# Patient Record
Sex: Female | Born: 1955
Health system: Southern US, Community
[De-identification: ages and names within clinical notes are randomized; demographics above are authoritative.]

## PROBLEM LIST (undated history)

## (undated) DIAGNOSIS — I10 Essential (primary) hypertension: Secondary | ICD-10-CM

## (undated) DIAGNOSIS — E039 Hypothyroidism, unspecified: Secondary | ICD-10-CM

## (undated) DIAGNOSIS — K5792 Diverticulitis of intestine, part unspecified, without perforation or abscess without bleeding: Secondary | ICD-10-CM

## (undated) DIAGNOSIS — E063 Autoimmune thyroiditis: Secondary | ICD-10-CM

## (undated) DIAGNOSIS — E119 Type 2 diabetes mellitus without complications: Secondary | ICD-10-CM

## (undated) DIAGNOSIS — E785 Hyperlipidemia, unspecified: Secondary | ICD-10-CM

## (undated) DIAGNOSIS — E079 Disorder of thyroid, unspecified: Secondary | ICD-10-CM

## (undated) HISTORY — PX: COLONOSCOPY W/ POLYPECTOMY: SHX1380

## (undated) HISTORY — PX: ABDOMINAL HYSTERECTOMY: SHX81

## (undated) HISTORY — PX: APPENDECTOMY: SHX54

## (undated) HISTORY — PX: CHOLECYSTECTOMY: SHX55

---

## 2006-04-03 ENCOUNTER — Ambulatory Visit (HOSPITAL_COMMUNITY): Admission: RE | Admit: 2006-04-03 | Discharge: 2006-04-03 | Payer: Self-pay | Admitting: Family Medicine

## 2006-04-13 ENCOUNTER — Emergency Department (HOSPITAL_COMMUNITY): Admission: EM | Admit: 2006-04-13 | Discharge: 2006-04-13 | Payer: Self-pay | Admitting: Emergency Medicine

## 2007-04-06 ENCOUNTER — Ambulatory Visit (HOSPITAL_COMMUNITY): Admission: RE | Admit: 2007-04-06 | Discharge: 2007-04-06 | Payer: Self-pay | Admitting: Family Medicine

## 2007-04-29 ENCOUNTER — Inpatient Hospital Stay (HOSPITAL_COMMUNITY): Admission: EM | Admit: 2007-04-29 | Discharge: 2007-04-30 | Payer: Self-pay | Admitting: Emergency Medicine

## 2007-04-30 ENCOUNTER — Ambulatory Visit: Payer: Self-pay | Admitting: Gastroenterology

## 2007-12-25 ENCOUNTER — Ambulatory Visit (HOSPITAL_COMMUNITY): Admission: RE | Admit: 2007-12-25 | Discharge: 2007-12-25 | Payer: Self-pay | Admitting: Family Medicine

## 2009-01-04 ENCOUNTER — Emergency Department (HOSPITAL_COMMUNITY): Admission: EM | Admit: 2009-01-04 | Discharge: 2009-01-04 | Payer: Self-pay | Admitting: Emergency Medicine

## 2010-01-12 ENCOUNTER — Ambulatory Visit (HOSPITAL_COMMUNITY): Admission: RE | Admit: 2010-01-12 | Discharge: 2010-01-12 | Payer: Self-pay | Admitting: Internal Medicine

## 2010-09-11 LAB — URINALYSIS, ROUTINE W REFLEX MICROSCOPIC
Glucose, UA: NEGATIVE mg/dL
Specific Gravity, Urine: >1.03 — ABNORMAL HIGH (ref 1.005–1.030)
Urobilinogen, UA: 0.2 mg/dL (ref 0.0–1.0)
pH: 5 (ref 5.0–8.0)

## 2010-09-11 LAB — DIFFERENTIAL
Basophils Absolute: 0 10*3/uL (ref 0.0–0.1)
Basophils Relative: 0 % (ref 0–1)
Eosinophils Relative: 1 % (ref 0–5)
Monocytes Absolute: 0.8 10*3/uL (ref 0.1–1.0)
Monocytes Relative: 6 % (ref 3–12)

## 2010-09-11 LAB — CBC
Platelets: 235 10*3/uL (ref 150–400)
RDW: 13.9 % (ref 11.5–15.5)
WBC: 12.8 10*3/uL — ABNORMAL HIGH (ref 4.0–10.5)

## 2010-09-11 LAB — COMPREHENSIVE METABOLIC PANEL
AST: 24 U/L (ref 0–37)
Albumin: 4 g/dL (ref 3.5–5.2)
Alkaline Phosphatase: 40 U/L (ref 39–117)
Chloride: 100 mEq/L (ref 96–112)
GFR calc Af Amer: >60 mL/min (ref >60)
Potassium: 3.6 mEq/L (ref 3.5–5.1)
Sodium: 138 mEq/L (ref 135–145)
Total Bilirubin: 0.9 mg/dL (ref 0.3–1.2)
Total Protein: 7 g/dL (ref 6.0–8.3)

## 2010-09-11 LAB — URINE MICROSCOPIC-ADD ON

## 2010-10-19 NOTE — Consult Note (Signed)
[name], [name]                 ACCOUNT NO.:  [id number]   MEDICAL RECORD NO.:  [id number]          PATIENT TYPE:  INP   LOCATION:  A311                          FACILITY:  [facility]   PHYSICIAN:  Kassie Mends, M.D.      DATE OF BIRTH:  December 18, 1955   DATE OF CONSULTATION:  04/30/2007  DATE OF DISCHARGE:                                 CONSULTATION   REQUESTING PHYSICIAN:  [facility].   PRIMARY CARE PHYSICIAN:  Dr. Phillips Odor.   GASTROENTEROLOGIST:  Dr. Elder Cyphers in [place], [place].   HISTORY OF PRESENT ILLNESS:  [name] is a 55 year old Caucasian female  who developed severe bilateral lower quadrant abdominal pain about 4  days ago. She did notice some loose, nonbloody stools with the pain. She  has had chills and fever. Finally, in the wee hours of yesterday  morning, she came to the hospital as the pain was so severe. She had 5  loose stools yesterday, 3 this morning. She was found to have a white  blood cell count of 15.3 yesterday, down to 9.1 today. CT of abdomen and  pelvis with contrast showed pericolonic stranding with the transverse  segment of the sigmoid colon associated with multiple diverticula. She  did have one episode of nausea and vomiting yesterday but feels this was  secondary to the pain medication. She has had no further vomiting since.  She has a history of occasional straining with stools prior to the onset  of this and occasional diarrhea but otherwise no chronic GI complaints.  She has been started on Cipro and Flagyl IV since admitted and feels  much improved.   PAST MEDICAL AND SURGICAL HISTORY:  She tells me she had diverticulitis  about one year ago. Back in 2006, she had a colonoscopy which reportedly  was normal. She does not remember being told that she ever had  diverticula or polyps. She had a cholecystectomy 10 years ago. She had  appendectomy as a child, complete hysterectomy 4 years ago. She has  occasional GERD for which she takes Tums  with good response rarely. She  tells me she had history of remote peptic ulcer disease with EGD by Dr.  Elder Cyphers years ago. She has history of disk problems. She has also had  problems with swallowing pills but no significant findings on EGD per  her report.   MEDICATIONS PRIOR TO ADMISSION:  Zebeta p.r.n. for palpitations.   ALLERGIES:  MORPHINE CAUSES VOMITING.   FAMILY HISTORY:  Significant for a father with adenomatous polyps and  diverticulitis as well as 2 paternal aunts and 2 paternal uncles with  colon cancer.   SOCIAL HISTORY:  [name] is married. 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 She has a remote  history of tobacco use. Denies any alcohol or drug use.   REVIEW OF SYSTEMS:  See HPI. Otherwise negative.   PHYSICAL EXAMINATION:  VITAL SIGNS:  Weight 107.4 kg, height 65 inches,  temperature 98.3 degrees, pulse 79, respirations 20, blood pressure  110/65, and O2 saturation  96% on room air.  GENERAL:  She is an obese, Caucasian female who is alert, oriented,  pleasant, cooperative in no acute distress.  HEENT:  Sclerae are clear, nonicteric. Conjunctivae are pink. Oral  mucosa pink and moist without any lesions.  NECK:  Supple without any masses or thyromegaly.  CHEST:  Heart regular rate and rhythm with normal S1 and S2 without  murmurs, clicks, rubs or gallops.  LUNGS:  Clear to auscultation bilaterally.  ABDOMEN:  Protuberant with positive bowel sounds x4. No bruits  auscultated.  She does have significant tenderness in the suprapubic area, left upper  quadrant and left lower quadrant and around the umbilicus. There is no  rebound tenderness or guarding. No hepatosplenomegaly or mass. No  rebound tenderness or guarding.  EXTREMITIES:  Without clubbing or edema bilaterally.  SKIN:  Pink, warm and dry without any rash or jaundice.   LABORATORY STUDIES:  She has a WBC of 9.1, hemoglobin 13, hematocrit  37.8,  platelets 253. Calcium 8.9, sodium 141, potassium 3.6, chloride  106, CO2 27, BUN 12, creatinine 0.86, and glucose 117. Urine specific  gravity was high as urinalysis showed trace blood, small leukocytes, few  squamous cells and few bacteria.   IMPRESSION:  [name] is a 55 year old female with acute onset of  second episode uncomplicated diverticulitis in the past 2 years since  her colonoscopy in [place], [place] by Dr. Elder Cyphers. There is no  history of diverticula on report per [name] but will request for  further review. She does have a strong family history of colon cancer  and polyps in her father and 2 paternal aunts and 2 paternal uncles. She  is responding well to Cipro and Flagyl. She is anxious for discharge  home and change to p.o. medications, which I feel is reasonable, given  the fact that she has had very little nausea and vomiting.   PLAN:  1. Will change to p.o. Cipro and Flagyl (she cannot swallow pills;      there is no liquid available per pocket with pharmacy). She may      crush her pills.  2. Low-residue diet.  3. Obtain colonoscopy report from Dr. Elder Cyphers. If there is no mention      of diverticula, would consider repeating colonoscopy in 8-10 weeks      once her diverticulitis is resolved, given her strong family      history of colon cancer.   We would like to thank the [facility] for allowing [place] to  participate in the care of [name].      Lorenza Burton, N.P.      Kassie Mends, M.D.  Electronically Signed    KJ/MEDQ  D:  04/30/2007  T:  04/30/2007  Job:  [id number]

## 2010-10-19 NOTE — H&P (Signed)
[name], Rios                 ACCOUNT NO.:  [id number]   MEDICAL RECORD NO.:  [id number]          PATIENT TYPE:  INP   LOCATION:  A311                          FACILITY:  [facility]   PHYSICIAN:  Marcello Moores, MD   DATE OF BIRTH:  [birth year]   DATE OF ADMISSION:  04/29/2007  DATE OF DISCHARGE:  LH                              HISTORY & PHYSICAL   PRIMARY CARE PHYSICIAN:  Dr. Berle Mull.   CHIEF COMPLAINT:  Abdominal pain and diarrhea of four days' duration   HISTORY OF PRESENT ILLNESS:  Ms. [name] is a 55 year old female patient  with history of diverticulosis and one episode of diverticulitis. She  started to have abdominal pain on the lower side more bilateral and with  episodes of nonbloody diarrhea for the last 4 days.  The patient stated  that the it was worse in the last 2 days, and she decided to come to the  emergency room. In the emergency room, scan of the abdomen was done and  showed signs of diverticulitis, and admission was called, and she was  admitted for further management and evaluation.  The patient stated that  she had no fever.  She had no urinary complaints, and this abdominal  pain was lower side, not localized, a dull pain and associated with  nonbloody frequent diarrhea 4-5 times a day.  She had one episode of  vomiting, and she has nausea as well.  She has no pulmonary or urinary  tract complaints. She was not taking any medications at home.   REVIEW OF SYSTEMS:  A 10-point Review of Systems is as dictated,  otherwise noncontributory   ALLERGIES:  No known drug allergies.   SOCIAL HISTORY:  She stopped smoking, and she denies alcohol or drug  abuse.   FAMILY HISTORY:  She has history of GI cancer in the family.   PAST MEDICAL HISTORY:  Diverticulosis with episode of diverticulitis.   HOME MEDICATIONS:  Zebeta 5 mg p.r.n.   PHYSICAL EXAMINATION:  GENERAL:  The patient is lying in the bed without  any distress.  VITAL SIGNS:  Temperature is 98.6, and  blood pressure is 120/65, pulse  89, respiratory rate 18, and saturation 98%.  HEENT:  She has pink conjunctivae.  Nonicteric sclera.  NECK:  Supple.  CHEST:  She has good air entry bilaterally.  CARDIOVASCULAR:  S1 and S2 regular.  ABDOMEN:  Soft.  Normoactive bowel sounds and slight tenderness on the  lower part of the abdomen, obese.  EXTREMITIES:  She has no pedal or pretibial edema.  CNS:  She is alert and oriented.   LABORATORY DATA:  White blood cells 15, hemoglobin 14, hematocrit 43,  platelet count 292. Chemistry:  Sodium 143,  potassium 4, chloride 101,  bicarb 30, glucose 136, BUN 13, creatinine 0.8. Urinalysis:  Blood cells  7-10, and blood culture is pending.   CAT scan of the abdomen as a preliminary report:  Sign of diverticulitis   ASSESSMENT:  Abdominal pain with episodes of diarrhea with history of  diverticulosis and episode of diverticulitis. Before the patient came  with similar episodes, and CAT scan showed sign of diverticulitis.  We  will admit her and put her on Flagyl and Cipro.  We will give her IV  fluids and put her n.p.o. for today.  We will consult GI tomorrow  morning for further evaluation.  Otherwise, the patient is fairly  stable, and she will have DVT as well as GI prophylaxis and pain  management as well.      Marcello Moores, MD  Electronically Signed     MT/MEDQ  D:  04/29/2007  T:  04/29/2007  Job:  [id number]

## 2010-10-19 NOTE — Discharge Summary (Signed)
[name], [name]                 ACCOUNT NO.:  [id number]   MEDICAL RECORD NO.:  [id number]          PATIENT TYPE:  INP   LOCATION:  A311                          FACILITY:  [facility]   PHYSICIAN:  Marcello Moores, MD   DATE OF BIRTH:  [birth year]   DATE OF ADMISSION:  04/29/2007  DATE OF DISCHARGE:  11/24/2008LH                               DISCHARGE SUMMARY   PRIMARY CARE PHYSICIAN:  Dr. Phillips Odor.   DISCHARGE DIAGNOSES:  1. Past history of one episode of diverticulitis, resolving.  2. History of diverticulosis.   HOME MEDICATIONS:  1. Flagyl 500 mg b.i.d. for 10 days.  2. Cipro 250 mg p.o. b.i.d. for 10 days.   HOSPITAL COURSE:  She is a 55 year old female patient with past medical  history of diverticulosis and one episode of diverticulitis last year,  and she came with abdominal pain and diarrhea of 4 days' duration.  She  was put on Flagyl and Cipro, and she improved overnight within the last  24 hours, and she asked to go home and have followup with her primary  care physician.  She was evaluated by gastroenterologist, Dr. Cira Servant, as  well, and she will have followup with her primary care physician  as  well as with a gastroenterologist.  She will have followup with surgeon  as well for possible elective surgery after discussing with her primary  care physician as well as with the patient.   PHYSICAL EXAMINATION:  VITAL SIGNS:  Today are stable with temperature  98, pulse 79, respiratory rate 20, blood pressure 110/55.  HEENT:  She has pink conjunctivae, nonicteric sclera.  NECK:  Supple.  CHEST:  She has good air entry bilaterally.  CARDIOVASCULAR:  S1-S2 regular.  ABDOMEN:  Soft.  Slight tenderness.  Otherwise normoactive bowel sounds.  EXTREMITIES:  No pedal edema.   LABORATORY DATA:  White blood cells 9, hemoglobin 13, hematocrit 37.8,  platelet count 253.  On the chemistries, sodium is 141, potassium 3.6,  chloride 106, CO 27, glucose 117, BUN 12, creatinine 0.8.   DISCHARGE/PLAN:  The patient is improved, and she wants to go home.  She  was discharged after discussed with her and with Dr. Cira Servant,  gastroenterologist.      Marcello Moores, MD  Electronically Signed     MT/MEDQ  D:  04/30/2007  T:  04/30/2007  Job:  [id number]   cc:   Dr. Phillips Odor

## 2011-03-15 LAB — BASIC METABOLIC PANEL
CO2: 27
Calcium: 8.9
Chloride: 106
GFR calc Af Amer: >60
Glucose, Bld: 117 — ABNORMAL HIGH
Sodium: 141

## 2011-03-15 LAB — CBC
HCT: 43
Hemoglobin: 13
Hemoglobin: 14.5
MCHC: 33.6
MCHC: 34.4
MCV: 90.6
MCV: 90.8
Platelets: 292
RBC: 4.17
RBC: 4.75
RDW: 13.8
RDW: 14
WBC: 15.3 — ABNORMAL HIGH

## 2011-03-15 LAB — COMPREHENSIVE METABOLIC PANEL
ALT: 24
AST: 22
Albumin: 4.1
Alkaline Phosphatase: 53
BUN: 13
CO2: 30
Calcium: 9.6
Chloride: 104
Creatinine, Ser: 0.89
GFR calc Af Amer: >60
GFR calc non Af Amer: >60
Glucose, Bld: 136 — ABNORMAL HIGH
Potassium: 4
Sodium: 143
Total Bilirubin: 0.7
Total Protein: 7.4

## 2011-03-15 LAB — URINE MICROSCOPIC-ADD ON

## 2011-03-15 LAB — CULTURE, BLOOD (ROUTINE X 2)
Culture: NO GROWTH
Culture: NO GROWTH
Report Status: 11282008
Report Status: 11282008

## 2011-03-15 LAB — URINALYSIS, ROUTINE W REFLEX MICROSCOPIC
Glucose, UA: NEGATIVE
Protein, ur: NEGATIVE
Specific Gravity, Urine: >1.03 — ABNORMAL HIGH
pH: 5

## 2011-03-15 LAB — DIFFERENTIAL
Basophils Absolute: 0
Basophils Relative: 1
Eosinophils Absolute: 0.1 — ABNORMAL LOW
Eosinophils Absolute: 0.2
Eosinophils Relative: 2
Lymphocytes Relative: 14
Lymphs Abs: 2.1
Monocytes Absolute: 0.6
Monocytes Relative: 5
Monocytes Relative: 6
Neutro Abs: 12.1 — ABNORMAL HIGH
Neutro Abs: 6
Neutrophils Relative %: 79 — ABNORMAL HIGH

## 2011-11-14 ENCOUNTER — Emergency Department (HOSPITAL_COMMUNITY): Payer: Managed Care, Other (non HMO)

## 2011-11-14 ENCOUNTER — Emergency Department (HOSPITAL_COMMUNITY)
Admission: EM | Admit: 2011-11-14 | Discharge: 2011-11-14 | Disposition: A | Discharge status: Home / Self Care | Payer: Managed Care, Other (non HMO) | Attending: Emergency Medicine | Admitting: Emergency Medicine

## 2011-11-14 ENCOUNTER — Encounter (HOSPITAL_COMMUNITY): Payer: Self-pay

## 2011-11-14 DIAGNOSIS — N200 Calculus of kidney: Secondary | ICD-10-CM | POA: Insufficient documentation

## 2011-11-14 DIAGNOSIS — E079 Disorder of thyroid, unspecified: Secondary | ICD-10-CM | POA: Insufficient documentation

## 2011-11-14 DIAGNOSIS — N1330 Unspecified hydronephrosis: Secondary | ICD-10-CM | POA: Insufficient documentation

## 2011-11-14 DIAGNOSIS — N23 Unspecified renal colic: Secondary | ICD-10-CM

## 2011-11-14 DIAGNOSIS — M549 Dorsalgia, unspecified: Secondary | ICD-10-CM | POA: Insufficient documentation

## 2011-11-14 DIAGNOSIS — K5730 Diverticulosis of large intestine without perforation or abscess without bleeding: Secondary | ICD-10-CM | POA: Insufficient documentation

## 2011-11-14 DIAGNOSIS — R110 Nausea: Secondary | ICD-10-CM | POA: Insufficient documentation

## 2011-11-14 HISTORY — DX: Disorder of thyroid, unspecified: E07.9

## 2011-11-14 HISTORY — DX: Diverticulitis of intestine, part unspecified, without perforation or abscess without bleeding: K57.92

## 2011-11-14 LAB — URINALYSIS, ROUTINE W REFLEX MICROSCOPIC
Bilirubin Urine: NEGATIVE
Glucose, UA: NEGATIVE mg/dL
Specific Gravity, Urine: >1.03 — ABNORMAL HIGH (ref 1.005–1.030)
Urobilinogen, UA: 0.2 mg/dL (ref 0.0–1.0)
pH: 6 (ref 5.0–8.0)

## 2011-11-14 LAB — URINE MICROSCOPIC-ADD ON

## 2011-11-14 MED ORDER — HYDROMORPHONE HCL PF 1 MG/ML IJ SOLN
1.0000 mg | Freq: Once | INTRAMUSCULAR | Status: DC
  Filled 2011-11-14: qty 1

## 2011-11-14 MED ORDER — TAMSULOSIN HCL 0.4 MG PO CAPS: 0.4000 mg | ORAL_CAPSULE | Freq: Every day | ORAL | Status: DC

## 2011-11-14 MED ORDER — KETOROLAC TROMETHAMINE 30 MG/ML IJ SOLN
30.0000 mg | Freq: Once | INTRAMUSCULAR | Status: AC
  Administered 2011-11-14: 30 mg via INTRAMUSCULAR
  Filled 2011-11-14: qty 1

## 2011-11-14 MED ORDER — HYDROMORPHONE HCL PF 1 MG/ML IJ SOLN
1.0000 mg | Freq: Once | INTRAMUSCULAR | Status: AC
  Administered 2011-11-14: 1 mg via INTRAMUSCULAR
  Filled 2011-11-14: qty 1

## 2011-11-14 MED ORDER — ONDANSETRON 8 MG PO TBDP: 8.0000 mg | ORAL_TABLET | Freq: Three times a day (TID) | ORAL | Status: AC | PRN

## 2011-11-14 MED ORDER — OXYCODONE-ACETAMINOPHEN 7.5-325 MG PO TABS: 1.0000 | ORAL_TABLET | ORAL | Status: AC | PRN

## 2011-11-14 NOTE — ED Provider Notes (Signed)
History  This chart was scribed for Tracey Baker, MD by Bennett Scrape. This patient was seen in room APA11/APA11 and the patient's care was started at 10:44AM.  CSN: [id number]  Arrival date & time 11/14/11  1016   First MD Initiated Contact with Patient 11/14/11 1044      Chief Complaint  Patient presents with  . Abdominal Pain    The history is provided by the patient. No language interpreter was used.    [name] is a 56 y.o. female who presents to the Emergency Department complaining of sudden onset, gradually worsening, constant right sided abdominal pain that radiates to the lower back with associated dysuria, urgency and frequency that started this morning. She denies any modifying factors. She has not taken any medications to improve symptoms. She denies having a h/o kidney stones. She denies fever, hematuria, flank pain and emesis. She has a h/o diverticulitis but denies similarities to prior episodes and thyroid disease. She denies smoking and alcohol use.  Past Medical History  Diagnosis Date  . Thyroid disease   . Diverticulitis     Past Surgical History  Procedure Date  . Cholecystectomy   . Appendectomy   . Abdominal hysterectomy     No family history on file.  History  Substance Use Topics  . Smoking status: Never Smoker   . Smokeless tobacco: Not on file  . Alcohol Use: No     Review of Systems  Constitutional: Negative for fever.       10 Systems reviewed and are negative for acute change except as noted in the HPI.  HENT: Negative for rhinorrhea.   Eyes: Negative for visual disturbance.  Respiratory: Negative for cough and shortness of breath.   Cardiovascular: Negative for chest pain.  Gastrointestinal: Positive for nausea and abdominal pain. Negative for vomiting and diarrhea.  Genitourinary: Positive for dysuria, urgency and frequency. Negative for hematuria and flank pain.  Musculoskeletal: Positive for back pain.  Skin: Negative for  rash.  Neurological: Negative for weakness and headaches.    Allergies  Morphine and related  Home Medications  No current outpatient prescriptions on file.  Triage Vitals: BP 163/86  Pulse 72  Temp(Src) 97.8 F (36.6 C) (Oral)  Resp 18  Ht 5\' 4"  (1.626 m)  Wt 213 lb (96.616 kg)  BMI 36.56 kg/m2  SpO2 100%  Physical Exam  Nursing note and vitals reviewed. Constitutional: She is oriented to person, place, and time. She appears well-developed and well-nourished. No distress.  HENT:  Head: Normocephalic and atraumatic.  Eyes: Conjunctivae and EOM are normal.  Neck: Neck supple. No tracheal deviation present.  Cardiovascular: Normal rate.   Pulmonary/Chest: Effort normal. No respiratory distress.  Abdominal: Soft. She exhibits no distension. There is no tenderness.       No CVA tenderness  Musculoskeletal: Normal range of motion.  Neurological: She is alert and oriented to person, place, and time. No sensory deficit.  Skin: Skin is warm and dry.  Psychiatric: She has a normal mood and affect. Her behavior is normal.    ED Course  Procedures (including critical care time)  DIAGNOSTIC STUDIES: Oxygen Saturation is 100% on room air, normal by my interpretation.    COORDINATION OF CARE: 10:51AM-Discussed treatment plan which includes urinalysis and CT scan with pt and pt agreed to plan. Pt turned down pain medications.    Labs Reviewed  URINALYSIS, ROUTINE W REFLEX MICROSCOPIC - Abnormal; Notable for the following:    Specific Gravity,  Urine >1.030 (*)    Hgb urine dipstick MODERATE (*)    Leukocytes, UA TRACE (*)    All other components within normal limits  URINE MICROSCOPIC-ADD ON - Abnormal; Notable for the following:    Squamous Epithelial / LPF FEW (*)    All other components within normal limits   Ct Abdomen Pelvis Wo Contrast  11/14/2011  *RADIOLOGY REPORT*  Clinical Data: Right lower quadrant and right flank pain.  CT ABDOMEN AND PELVIS WITHOUT CONTRAST   Technique:  Multidetector CT imaging of the abdomen and pelvis was performed following the standard protocol without intravenous contrast.  Comparison: 01/04/2009.  Findings: Lung bases show no acute findings.  Heart size normal. No pericardial or pleural effusion.  Liver is unremarkable.  Cholecystectomy.  Adrenal glands are unremarkable.  Very mild right hydronephrosis secondary to a 3 mm stone at the right ureteral vesicle junction.  Mild peri ureteric and perinephric stranding on the right as well.  Left kidney, spleen, pancreas, stomach and bowel are unremarkable.  No pathologically enlarged lymph nodes.  No free fluid.  Hysterectomy. Trace atherosclerotic calcification of the arterial vasculature. No worrisome lytic or sclerotic lesions.  Degenerative changes are seen in the spine.  IMPRESSION: Minimal right hydronephrosis secondary to a 3 mm right ureteral vesicle junction stone.  Original Report Authenticated By: Reyes Ivan, M.D.     No diagnosis found.    MDM   1:19 PM Patient given pain medication here he does flow better. Studies are consistent with a kidney stone. Patient be given referral to urology on-call and prescription for analgesics    I personally performed the services described in this documentation, which was scribed in my presence. The recorded information has been reviewed and considered.    Tracey Baker, MD 11/14/11 1320

## 2011-11-14 NOTE — Discharge Instructions (Signed)

## 2011-11-14 NOTE — ED Notes (Signed)
Pt reports woke up this am and was having frequent urination and burning with urination.  Reports was only voiding small amounts.  Reports went to work and had sudden onset of severe r side pain radiating through to back.  Denies history of kidney stones.  Reports little nausea, no vomiting.

## 2012-04-03 ENCOUNTER — Other Ambulatory Visit (HOSPITAL_COMMUNITY): Payer: Self-pay | Admitting: Endocrinology

## 2012-04-03 DIAGNOSIS — E049 Nontoxic goiter, unspecified: Secondary | ICD-10-CM

## 2012-04-13 ENCOUNTER — Ambulatory Visit (HOSPITAL_COMMUNITY): Payer: Managed Care, Other (non HMO)

## 2012-04-13 ENCOUNTER — Other Ambulatory Visit (HOSPITAL_COMMUNITY): Payer: Managed Care, Other (non HMO)

## 2013-04-05 ENCOUNTER — Other Ambulatory Visit (HOSPITAL_COMMUNITY): Payer: Self-pay | Admitting: Endocrinology

## 2013-04-05 DIAGNOSIS — E049 Nontoxic goiter, unspecified: Secondary | ICD-10-CM

## 2013-04-10 ENCOUNTER — Other Ambulatory Visit (HOSPITAL_COMMUNITY): Payer: Managed Care, Other (non HMO)

## 2013-04-18 ENCOUNTER — Ambulatory Visit (HOSPITAL_COMMUNITY)
Admission: RE | Admit: 2013-04-18 | Discharge: 2013-04-18 | Disposition: A | Discharge status: Home / Self Care | Payer: Managed Care, Other (non HMO) | Source: Ambulatory Visit | Attending: Endocrinology | Admitting: Endocrinology

## 2013-04-18 DIAGNOSIS — E049 Nontoxic goiter, unspecified: Secondary | ICD-10-CM

## 2013-04-18 DIAGNOSIS — E041 Nontoxic single thyroid nodule: Secondary | ICD-10-CM | POA: Insufficient documentation

## 2013-04-25 ENCOUNTER — Other Ambulatory Visit (HOSPITAL_COMMUNITY): Payer: Self-pay | Admitting: Endocrinology

## 2013-04-25 DIAGNOSIS — E041 Nontoxic single thyroid nodule: Secondary | ICD-10-CM

## 2013-05-01 ENCOUNTER — Emergency Department (HOSPITAL_COMMUNITY)
Admission: EM | Admit: 2013-05-01 | Discharge: 2013-05-02 | Disposition: A | Discharge status: Home / Self Care | Payer: Managed Care, Other (non HMO) | Attending: Emergency Medicine | Admitting: Emergency Medicine

## 2013-05-01 ENCOUNTER — Emergency Department (HOSPITAL_COMMUNITY): Payer: Managed Care, Other (non HMO)

## 2013-05-01 ENCOUNTER — Encounter (HOSPITAL_COMMUNITY): Payer: Self-pay | Admitting: Emergency Medicine

## 2013-05-01 DIAGNOSIS — R112 Nausea with vomiting, unspecified: Secondary | ICD-10-CM | POA: Insufficient documentation

## 2013-05-01 DIAGNOSIS — Z79899 Other long term (current) drug therapy: Secondary | ICD-10-CM | POA: Insufficient documentation

## 2013-05-01 DIAGNOSIS — Z7982 Long term (current) use of aspirin: Secondary | ICD-10-CM | POA: Insufficient documentation

## 2013-05-01 DIAGNOSIS — R1110 Vomiting, unspecified: Secondary | ICD-10-CM

## 2013-05-01 DIAGNOSIS — R1013 Epigastric pain: Secondary | ICD-10-CM | POA: Insufficient documentation

## 2013-05-01 DIAGNOSIS — R197 Diarrhea, unspecified: Secondary | ICD-10-CM | POA: Insufficient documentation

## 2013-05-01 DIAGNOSIS — E079 Disorder of thyroid, unspecified: Secondary | ICD-10-CM | POA: Insufficient documentation

## 2013-05-01 DIAGNOSIS — R109 Unspecified abdominal pain: Secondary | ICD-10-CM

## 2013-05-01 DIAGNOSIS — Z8719 Personal history of other diseases of the digestive system: Secondary | ICD-10-CM | POA: Insufficient documentation

## 2013-05-01 LAB — COMPREHENSIVE METABOLIC PANEL
ALT: 39 U/L — ABNORMAL HIGH (ref 0–35)
AST: 35 U/L (ref 0–37)
Alkaline Phosphatase: 57 U/L (ref 39–117)
CO2: 27 mEq/L (ref 19–32)
Calcium: 10.3 mg/dL (ref 8.4–10.5)
Potassium: 4.5 mEq/L (ref 3.5–5.1)
Sodium: 136 mEq/L (ref 135–145)
Total Protein: 8 g/dL (ref 6.0–8.3)

## 2013-05-01 LAB — URINALYSIS, ROUTINE W REFLEX MICROSCOPIC
Glucose, UA: NEGATIVE mg/dL
Leukocytes, UA: NEGATIVE
pH: 5 (ref 5.0–8.0)

## 2013-05-01 MED ORDER — FENTANYL CITRATE 0.05 MG/ML IJ SOLN
25.0000 ug | Freq: Once | INTRAMUSCULAR | Status: AC
  Administered 2013-05-01: 100 ug via INTRAVENOUS
  Filled 2013-05-01: qty 2

## 2013-05-01 MED ORDER — ONDANSETRON HCL 4 MG/2ML IJ SOLN
4.0000 mg | Freq: Once | INTRAMUSCULAR | Status: AC
  Administered 2013-05-01: 4 mg via INTRAVENOUS
  Filled 2013-05-01: qty 2

## 2013-05-01 MED ORDER — SODIUM CHLORIDE 0.9 % IV BOLUS (SEPSIS)
1000.0000 mL | Freq: Once | INTRAVENOUS | Status: AC
  Administered 2013-05-01: 1000 mL via INTRAVENOUS

## 2013-05-01 NOTE — ED Provider Notes (Signed)
CSN: [id number]     Arrival date & time 05/01/13  2202 History   First MD Initiated Contact with Patient 05/01/13 2304     Chief Complaint  Patient presents with  . Abdominal Pain   (Consider location/radiation/quality/duration/timing/severity/associated sxs/prior Treatment) HPI Patient presents with acute onset upper, pain with multiple episodes of vomiting and diarrhea. Said symptoms started roughly around 7 PM. She's had no blood in the vomit or in the diarrhea. Denies fevers or chills. Denies chest pain or shortness of breath. Plus initiated with some blackeye piece at noon. No recent international travel or sick contacts. Patient has had her gallbladder removed in the past. Past Medical History  Diagnosis Date  . Thyroid disease   . Diverticulitis    Past Surgical History  Procedure Laterality Date  . Cholecystectomy    . Appendectomy    . Abdominal hysterectomy     History reviewed. No pertinent family history. History  Substance Use Topics  . Smoking status: Never Smoker   . Smokeless tobacco: Not on file  . Alcohol Use: No   OB History   Grav Para Term Preterm Abortions TAB SAB Ect Mult Living                 Review of Systems  Constitutional: Negative for fever and chills.  Respiratory: Negative for shortness of breath.   Cardiovascular: Negative for chest pain.  Gastrointestinal: Positive for nausea, vomiting, abdominal pain and diarrhea. Negative for constipation and blood in stool.  Genitourinary: Negative for dysuria, hematuria and flank pain.  Musculoskeletal: Negative for back pain, myalgias, neck pain and neck stiffness.  Skin: Negative for rash and wound.  Neurological: Negative for dizziness, weakness, light-headedness, numbness and headaches.  All other systems reviewed and are negative.    Allergies  Morphine and related  Home Medications   Current Outpatient Rx  Name  Route  Sig  Dispense  Refill  . chlorpheniramine-HYDROcodone (TUSSIONEX)  10-8 MG/5ML LQCR   Oral   Take 5 mLs by mouth 2 (two) times daily as needed. For cough         . cholecalciferol (VITAMIN D) 1000 UNITS tablet   Oral   Take 1,000 Units by mouth daily.         Marland Kitchen levothyroxine (SYNTHROID, LEVOTHROID) 175 MCG tablet   Oral   Take 175 mcg by mouth daily before breakfast.         . Lutein 20 MG TABS   Oral   Take 20 mg by mouth daily.         . Multiple Vitamin (MULTIVITAMIN WITH MINERALS) TABS   Oral   Take 1 tablet by mouth daily.         . polycarbophil (FIBERCON) 625 MG tablet   Oral   Take 2 tablets by mouth daily.         Marland Kitchen aspirin EC 81 MG tablet   Oral   Take 81 mg by mouth daily.          BP 134/84  Pulse 97  Temp(Src) 98.3 F (36.8 C) (Oral)  Resp 20  Ht 5\' 4"  (1.626 m)  Wt 240 lb (108.863 kg)  BMI 41.18 kg/m2  SpO2 97% Physical Exam  Nursing note and vitals reviewed. Constitutional: She is oriented to person, place, and time. She appears well-developed and well-nourished. No distress.  HENT:  Head: Normocephalic and atraumatic.  Mouth/Throat: Oropharynx is clear and moist.  Eyes: EOM are normal. Pupils are equal, round, and  reactive to light.  Neck: Normal range of motion. Neck supple.  Cardiovascular: Normal rate and regular rhythm.   Pulmonary/Chest: Effort normal and breath sounds normal. No respiratory distress. She has no wheezes. She has no rales.  Abdominal: Soft. Bowel sounds are normal. She exhibits no distension and no mass. There is tenderness (tender to palpation in the epigastrium. No rebound or guarding.). There is no rebound and no guarding.  Musculoskeletal: Normal range of motion. She exhibits no edema and no tenderness.  No CVA tenderness bilaterally.  Neurological: She is alert and oriented to person, place, and time.  Was extremities without deficit. Sensation grossly intact.  Skin: Skin is warm and dry. No rash noted. No erythema.  Psychiatric: She has a normal mood and affect. Her  behavior is normal.    ED Course  Procedures (including critical care time) Labs Review Labs Reviewed  CBC WITH DIFFERENTIAL  COMPREHENSIVE METABOLIC PANEL  LIPASE, BLOOD  TROPONIN I  URINALYSIS, ROUTINE W REFLEX MICROSCOPIC   Imaging Review No results found.  EKG Interpretation   None       MDM   Patient states she is feeling much better after the IV fluids, antiemetics and pain medication. She's had no further vomiting in the emergency department. She is tolerating PO's. Her vital signs remained stable. Her abdomen is soft and nontender. Her laboratory workup showed that she had an elevated white blood cell count but that her liver function tests and lipase were normal. CT scan without any evidence of a surgical cause for the patient's symptoms. I suspect patient either has food poisoning or a viral gastroenteritis. I informed the patient of the results of her tests. I have given the patient strict return precautions for worsening pain, fever, persistent vomiting or for any concerns. Patient and her husband have was understanding and are requesting discharge.  Loren Racer, MD 05/02/13 [phone]

## 2013-05-01 NOTE — ED Notes (Signed)
Upper abd pain, with NVD

## 2013-05-02 ENCOUNTER — Emergency Department (HOSPITAL_COMMUNITY): Payer: Managed Care, Other (non HMO)

## 2013-05-02 LAB — CBC WITH DIFFERENTIAL/PLATELET
Eosinophils Absolute: 0.3 10*3/uL (ref 0.0–0.7)
Hemoglobin: 14.4 g/dL (ref 12.0–15.0)
Lymphs Abs: 2.2 10*3/uL (ref 0.7–4.0)
MCH: 31.7 pg (ref 26.0–34.0)
Neutro Abs: 22.8 10*3/uL — ABNORMAL HIGH (ref 1.7–7.7)
Neutrophils Relative %: 85 % — ABNORMAL HIGH (ref 43–77)
Platelets: 311 10*3/uL (ref 150–400)
RBC: 4.54 MIL/uL (ref 3.87–5.11)
WBC: 26.9 10*3/uL — ABNORMAL HIGH (ref 4.0–10.5)

## 2013-05-02 MED ORDER — SODIUM CHLORIDE 0.9 % IV BOLUS (SEPSIS)
1000.0000 mL | Freq: Once | INTRAVENOUS | Status: AC
  Administered 2013-05-02: 1000 mL via INTRAVENOUS

## 2013-05-02 MED ORDER — HYDROCODONE-ACETAMINOPHEN 5-325 MG PO TABS: 1.0000 | ORAL_TABLET | ORAL | Status: DC | PRN

## 2013-05-02 MED ORDER — IOHEXOL 300 MG/ML  SOLN
100.0000 mL | Freq: Once | INTRAMUSCULAR | Status: AC | PRN
  Administered 2013-05-02: 100 mL via INTRAVENOUS

## 2013-05-02 MED ORDER — ONDANSETRON 4 MG PO TBDP: ORAL_TABLET | ORAL | Status: DC

## 2013-05-02 MED ORDER — IOHEXOL 300 MG/ML  SOLN
50.0000 mL | Freq: Once | INTRAMUSCULAR | Status: AC | PRN
  Administered 2013-05-02: 50 mL via ORAL

## 2013-05-02 NOTE — ED Notes (Signed)
IV repositioned and taped due to NS not infusing.  NS infusing well at this time.  Patient awaiting CT scan.

## 2013-05-02 NOTE — ED Notes (Signed)
Patient ambulatory to bathroom with assistance from husband.  Patient continues to deny pain, but states still having diarrhea.

## 2013-05-02 NOTE — ED Notes (Signed)
Patient given discharge instruction, verbalized understand. IV removed, band aid applied. Patient ambulatory out of the department.  

## 2013-05-09 ENCOUNTER — Ambulatory Visit (HOSPITAL_COMMUNITY)
Admission: RE | Admit: 2013-05-09 | Discharge: 2013-05-09 | Disposition: A | Discharge status: Home / Self Care | Payer: Managed Care, Other (non HMO) | Source: Ambulatory Visit | Attending: Endocrinology | Admitting: Endocrinology

## 2013-05-09 ENCOUNTER — Encounter (HOSPITAL_COMMUNITY): Payer: Self-pay

## 2013-05-09 DIAGNOSIS — E041 Nontoxic single thyroid nodule: Secondary | ICD-10-CM

## 2013-05-09 MED ORDER — LIDOCAINE HCL (PF) 2 % IJ SOLN
INTRAMUSCULAR | Status: AC
  Filled 2013-05-09: qty 10

## 2013-05-09 MED ORDER — LIDOCAINE HCL (PF) 2 % IJ SOLN: 10.0000 mL | Freq: Once | INTRAMUSCULAR | Status: DC

## 2013-05-20 ENCOUNTER — Ambulatory Visit (INDEPENDENT_AMBULATORY_CARE_PROVIDER_SITE_OTHER): Payer: Managed Care, Other (non HMO) | Admitting: Internal Medicine

## 2013-05-20 ENCOUNTER — Encounter: Payer: Self-pay | Admitting: Internal Medicine

## 2013-05-20 VITALS — BP 140/92 | HR 66 | Temp 98.0°F | Ht 64.25 in | Wt 242.0 lb

## 2013-05-20 DIAGNOSIS — R05 Cough: Secondary | ICD-10-CM | POA: Insufficient documentation

## 2013-05-20 MED ORDER — FAMOTIDINE 20 MG PO TABS: ORAL_TABLET | ORAL | Status: DC

## 2013-05-20 MED ORDER — PREDNISONE (PAK) 10 MG PO TABS: ORAL_TABLET | ORAL | Status: DC

## 2013-05-20 MED ORDER — PANTOPRAZOLE SODIUM 40 MG PO TBEC: 40.0000 mg | DELAYED_RELEASE_TABLET | Freq: Every day | ORAL | Status: DC

## 2013-05-20 NOTE — Assessment & Plan Note (Signed)
The most common causes of chronic cough in immunocompetent adults include the following: upper airway cough syndrome (UACS), previously referred to as postnasal drip syndrome (PNDS), which is caused by variety of rhinosinus conditions; (2) asthma; (3) GERD; (4) chronic bronchitis from cigarette smoking or other inhaled environmental irritants; (5) nonasthmatic eosinophilic bronchitis; and (6) bronchiectasis.   These conditions, singly or in combination, have accounted for up to 94% of the causes of chronic cough in prospective studies.   Other conditions have constituted no >6% of the causes in prospective studies These have included bronchogenic carcinoma, chronic interstitial pneumonia, sarcoidosis, left ventricular failure, ACEI-induced cough, and aspiration from a condition associated with pharyngeal dysfunction.    Chronic cough is often simultaneously caused by more than one condition. A single cause has been found from 38 to 82% of the time, multiple causes from 18 to 62%. Multiply caused cough has been the result of three diseases up to 42% of the time.        Classic Upper airway cough syndrome, so named because it's frequently impossible to sort out how much is  CR/sinusitis with freq throat clearing (which can be related to primary GERD)   vs  causing  secondary (" extra esophageal")  GERD from wide swings in gastric pressure that occur with throat clearing, often  promoting self use of mint and menthol lozenges that reduce the lower esophageal sphincter tone and exacerbate the problem further in a cyclical fashion.   These are the same pts (now being labeled as having "irritable larynx syndrome" by some cough centers) who not infrequently have a history of having failed to tolerate ace inhibitors,  dry powder inhalers or biphosphonates or report having atypical reflux symptoms that don't respond to standard doses of PPI , and are easily confused as having aecopd or asthma flares by even  experienced allergists/ pulmonologists.   For now max rx for gerd/cyclical cough then return if not 100% better

## 2013-05-20 NOTE — Progress Notes (Signed)
   Subjective:    Patient ID: [name], female    DOB: 1955/12/05  MRN: [id number]  HPI  1 yowf Echo tech quit smoking 2007 with some breathing which resolved 100% referred to pulmonary clinic 05/20/2013 by Dr Talmage Nap for evaluation of chronic cough.    05/20/2013 1st [name] office visit/ [name] cc acute onset late Oct 2014  with URI like symptoms = fever, aching, chills, sore thoat and dry cough only sore throat are remaining.  rx tussionex, only half a bottle, sore in abd from coughing.   Delsym and tessilon not effective  No obvious day to day or daytime variabilty or assoc sob  or cp or chest tightness, subjective wheeze overt sinus or hb symptoms. No unusual exp hx or h/o childhood pna/ asthma or knowledge of premature birth.  Sleeping ok without nocturnal  or early am exacerbation  of respiratory  c/o's or need for noct saba. Also denies any obvious fluctuation of symptoms with weather or environmental changes or other aggravating or alleviating factors except as outlined above   Current Medications, Allergies, Complete Past Medical History, Past Surgical History, Family History, and Social History were reviewed in [employer] record.           Review of Systems  Constitutional: Negative for fever, chills and unexpected weight change.  HENT: Positive for sore throat. Negative for congestion, dental problem, ear pain, nosebleeds, postnasal drip, rhinorrhea, sinus pressure, sneezing, trouble swallowing and voice change.   Eyes: Negative for visual disturbance.  Respiratory: Positive for cough. Negative for choking and shortness of breath.   Cardiovascular: Negative for chest pain and leg swelling.  Gastrointestinal: Negative for vomiting, abdominal pain and diarrhea.  Genitourinary: Negative for difficulty urinating.       Indigestion  Musculoskeletal: Negative for arthralgias.  Skin: Negative for rash.  Neurological: Negative for tremors,  syncope and headaches.  Hematological: Does not bruise/bleed easily.       Objective:   Physical Exam  Obese amb wf nad x for freq throat clearin g  Wt Readings from Last 3 Encounters:  05/20/13 242 lb (109.77 kg)  05/01/13 240 lb (108.863 kg)  11/14/11 213 lb (96.616 kg)      HEENT: nl dentition, turbinates, and orophanx. Nl external ear canals without cough reflex   NECK :  without JVD/Nodes/TM/ nl carotid upstrokes bilaterally   LUNGS: no acc muscle use, clear to A and P bilaterally without cough on insp or exp maneuvers   CV:  RRR  no s3 or murmur or increase in P2, no edema   ABD:  soft and nontender with nl excursion in the supine position. No bruits or organomegaly, bowel sounds nl  MS:  warm without deformities, calf tenderness, cyanosis or clubbing  SKIN: warm and dry without lesions    NEURO:  alert, approp, no deficits    04/05/13 cxr ok       Assessment & Plan:

## 2013-05-20 NOTE — Patient Instructions (Signed)
The key to effective treatment for your cough is eliminating the non-stop cycle of cough you're stuck in long enough to let your airway heal completely and then see if there is anything still making you cough once you stop the cough suppression, but this should take no more than 5 days to figure out  During the day use delsym for the cough and as soon as you get home or weekends use the tussionex    Pantoprazole (protonix) 40 mg   Take 30-60 min before first meal of the day and Pepcid 20 mg one bedtime until return to office - this is the best way to tell whether stomach acid is contributing to your problem.  Marland Kitchen  GERD (REFLUX)  is an extremely common cause of respiratory symptoms, many times with no significant heartburn at all.    It can be treated with medication, but also with lifestyle changes including avoidance of late meals, excessive alcohol, smoking cessation, and avoid fatty foods, chocolate, peppermint, colas, red wine, and acidic juices such as orange juice.  NO MINT OR MENTHOL PRODUCTS SO NO COUGH DROPS  USE SUGARLESS CANDY INSTEAD (jolley ranchers or Stover's or life savers) NO OIL BASED VITAMINS - use powdered substitutes.    Prednisone 10 mg take  4 each am x 2 days,   2 each am x 2 days,  1 each am x 2 days and stop    If not 100% return on a Thursday afternoon or Friday am for more vigorous cough suppression

## 2014-09-29 ENCOUNTER — Encounter (INDEPENDENT_AMBULATORY_CARE_PROVIDER_SITE_OTHER): Payer: Self-pay | Admitting: *Deleted

## 2014-10-01 ENCOUNTER — Encounter (INDEPENDENT_AMBULATORY_CARE_PROVIDER_SITE_OTHER): Payer: Self-pay | Admitting: *Deleted

## 2014-10-22 ENCOUNTER — Other Ambulatory Visit (INDEPENDENT_AMBULATORY_CARE_PROVIDER_SITE_OTHER): Payer: Self-pay | Admitting: *Deleted

## 2014-10-22 ENCOUNTER — Ambulatory Visit (INDEPENDENT_AMBULATORY_CARE_PROVIDER_SITE_OTHER): Payer: Managed Care, Other (non HMO) | Admitting: Internal Medicine

## 2014-10-22 ENCOUNTER — Telehealth (INDEPENDENT_AMBULATORY_CARE_PROVIDER_SITE_OTHER): Payer: Self-pay | Admitting: *Deleted

## 2014-10-22 ENCOUNTER — Encounter (INDEPENDENT_AMBULATORY_CARE_PROVIDER_SITE_OTHER): Payer: Self-pay | Admitting: Internal Medicine

## 2014-10-22 VITALS — BP 112/68 | HR 64 | Temp 97.5°F | Ht 64.0 in | Wt 191.3 lb

## 2014-10-22 DIAGNOSIS — K5909 Other constipation: Secondary | ICD-10-CM

## 2014-10-22 DIAGNOSIS — R195 Other fecal abnormalities: Secondary | ICD-10-CM | POA: Diagnosis not present

## 2014-10-22 DIAGNOSIS — Z1211 Encounter for screening for malignant neoplasm of colon: Secondary | ICD-10-CM

## 2014-10-22 DIAGNOSIS — Z800 Family history of malignant neoplasm of digestive organs: Secondary | ICD-10-CM

## 2014-10-22 MED ORDER — PEG 3350-KCL-NA BICARB-NACL 420 G PO SOLR: 4000.0000 mL | Freq: Once | ORAL | Status: DC

## 2014-10-22 NOTE — Patient Instructions (Signed)
Colonoscopy.  The risks and benefits such as perforation, bleeding, and infection were reviewed with the patient and is agreeable. 

## 2014-10-22 NOTE — Telephone Encounter (Signed)
Patient needs trilyte 

## 2014-10-22 NOTE — Progress Notes (Addendum)
   Subjective:    Patient ID: Tracey CaddyKaren V Gosling, female    DOB: 03/10/1956, 59 y.o.   MRN: [id number]  HPI Referred to our office by Dr. Phillips OdorGolding for colonoscopy.  She tells me she usually hs 3-4 BMs a day. She tells me she has had constipation for several months.  She is taking Colace and Align for the constipation. She says when she had a BM, it would be small. She now has a BM every 1-2 days.  She denies seeing any [facility]. No melena. Stools are brown. She tells me it is painful to have a BM. These symptoms are all new to her. She has had weight loss intentionally because of her diabetes. She has lost about from 255 to 191.3. She also recently treated empirically for diverticulitis and started on Cipro. Pain was lower abdomen. She still has some tenderness. Treated by Dr. Hyacinth MeekerMiller Her blood sugar run 99. HA1C 5.0 March. Her last colonoscopy was in 2014 by Dr. Elder CyphersShiflett. Small polyp in the sigmoid removed by snare and cautery and retrieved. Biopsy: Inflamed hyperplastic polyp of sigmoid colon.  There is a strong family hx of colon cancer. All her father's brothers and sisters had colon cancer in their 6060 and are still living.       Review of Systems Past Medical History  Diagnosis Date  . Thyroid disease   . Diverticulitis     Past Surgical History  Procedure Laterality Date  . Cholecystectomy    . Appendectomy    . Abdominal hysterectomy    . Colonoscopy w/ polypectomy      2014 Dr. Fonda KinderShefflett    Allergies  Allergen Reactions  . Morphine And Related Nausea And Vomiting    Current Outpatient Prescriptions on File Prior to Visit  Medication Sig Dispense Refill  . aspirin EC 81 MG tablet Take 81 mg by mouth daily.    . cholecalciferol (VITAMIN D) 1000 UNITS tablet Take 1,000 Units by mouth daily.    Marland Kitchen. levothyroxine (SYNTHROID, LEVOTHROID) 175 MCG tablet Take 125 mcg by mouth daily before breakfast.     . Lutein 20 MG TABS Take 20 mg by mouth daily.    . Multiple Vitamin (MULTIVITAMIN  WITH MINERALS) TABS Take 1 tablet by mouth daily.     No current facility-administered medications on file prior to visit.        Objective:   Physical Exam Blood pressure 112/68, pulse 64, temperature 97.5 F (36.4 C), height 5\' 4"  (1.626 m), weight 191 lb 4.8 oz (86.773 kg).  Alert and oriented. Skin warm and dry. Oral mucosa is moist.   . Sclera anicteric, conjunctivae is pink. Thyroid not enlarged. No cervical lymphadenopathy. Lungs clear. Heart regular rate and rhythm.  Abdomen is soft. Bowel sounds are positive. No hepatomegaly. No abdominal masses felt. No tenderness.  No edema to lower extremities.  Stool brown an guaiac negative.   Developer: [id number] ( 04/2018) Card: Lot [id number] (12/18)     Assessment & Plan:  Change in stool, Painful defecation. Family hx of colon cancer. Colonoscopy. The risks and benefits such as perforation, bleeding, and infection were reviewed with the patient and is agreeable.

## 2014-10-27 ENCOUNTER — Encounter (INDEPENDENT_AMBULATORY_CARE_PROVIDER_SITE_OTHER): Payer: Self-pay

## 2014-10-27 ENCOUNTER — Ambulatory Visit (INDEPENDENT_AMBULATORY_CARE_PROVIDER_SITE_OTHER): Payer: Managed Care, Other (non HMO) | Admitting: Internal Medicine

## 2014-11-10 ENCOUNTER — Other Ambulatory Visit (HOSPITAL_COMMUNITY): Payer: Self-pay | Admitting: Family Medicine

## 2014-11-10 DIAGNOSIS — Z1231 Encounter for screening mammogram for malignant neoplasm of breast: Secondary | ICD-10-CM

## 2014-11-27 ENCOUNTER — Encounter (HOSPITAL_COMMUNITY): Payer: Self-pay | Admitting: *Deleted

## 2014-11-27 ENCOUNTER — Encounter (HOSPITAL_COMMUNITY)
Admission: RE | Disposition: A | Discharge status: Home / Self Care | Payer: Self-pay | Source: Ambulatory Visit | Attending: Internal Medicine

## 2014-11-27 ENCOUNTER — Ambulatory Visit (HOSPITAL_COMMUNITY)
Admission: RE | Admit: 2014-11-27 | Discharge: 2014-11-27 | Disposition: A | Discharge status: Home / Self Care | Payer: Managed Care, Other (non HMO) | Source: Ambulatory Visit | Attending: Internal Medicine | Admitting: Internal Medicine

## 2014-11-27 DIAGNOSIS — E079 Disorder of thyroid, unspecified: Secondary | ICD-10-CM | POA: Diagnosis not present

## 2014-11-27 DIAGNOSIS — Z86010 Personal history of colonic polyps: Secondary | ICD-10-CM | POA: Diagnosis not present

## 2014-11-27 DIAGNOSIS — Z79899 Other long term (current) drug therapy: Secondary | ICD-10-CM | POA: Insufficient documentation

## 2014-11-27 DIAGNOSIS — E119 Type 2 diabetes mellitus without complications: Secondary | ICD-10-CM | POA: Insufficient documentation

## 2014-11-27 DIAGNOSIS — K5730 Diverticulosis of large intestine without perforation or abscess without bleeding: Secondary | ICD-10-CM | POA: Insufficient documentation

## 2014-11-27 DIAGNOSIS — Z800 Family history of malignant neoplasm of digestive organs: Secondary | ICD-10-CM | POA: Insufficient documentation

## 2014-11-27 DIAGNOSIS — R195 Other fecal abnormalities: Secondary | ICD-10-CM

## 2014-11-27 DIAGNOSIS — Z7982 Long term (current) use of aspirin: Secondary | ICD-10-CM | POA: Diagnosis not present

## 2014-11-27 DIAGNOSIS — Z87891 Personal history of nicotine dependence: Secondary | ICD-10-CM | POA: Insufficient documentation

## 2014-11-27 DIAGNOSIS — R194 Change in bowel habit: Secondary | ICD-10-CM | POA: Diagnosis not present

## 2014-11-27 HISTORY — PX: COLONOSCOPY: SHX5424

## 2014-11-27 LAB — GLUCOSE, CAPILLARY
Glucose-Capillary: 75 mg/dL (ref 65–99)
Glucose-Capillary: 162 mg/dL — ABNORMAL HIGH (ref 65–99)

## 2014-11-27 SURGERY — COLONOSCOPY
Anesthesia: Moderate Sedation

## 2014-11-27 MED ORDER — MIDAZOLAM HCL 5 MG/5ML IJ SOLN
INTRAMUSCULAR | Status: AC
  Filled 2014-11-27: qty 5

## 2014-11-27 MED ORDER — MIDAZOLAM HCL 5 MG/5ML IJ SOLN
INTRAMUSCULAR | Status: AC
  Filled 2014-11-27: qty 10

## 2014-11-27 MED ORDER — MIDAZOLAM HCL 5 MG/5ML IJ SOLN
INTRAMUSCULAR | Status: DC | PRN
  Administered 2014-11-27 (×2): 3 mg via INTRAVENOUS
  Administered 2014-11-27 (×3): 2 mg via INTRAVENOUS

## 2014-11-27 MED ORDER — MEPERIDINE HCL 50 MG/ML IJ SOLN
INTRAMUSCULAR | Status: AC
  Filled 2014-11-27: qty 1

## 2014-11-27 MED ORDER — STERILE WATER FOR IRRIGATION IR SOLN
Status: DC | PRN
  Administered 2014-11-27: 14:00:00

## 2014-11-27 MED ORDER — SODIUM CHLORIDE 0.9 % IV SOLN
INTRAVENOUS | Status: DC
  Administered 2014-11-27: 13:00:00 via INTRAVENOUS

## 2014-11-27 MED ORDER — DEXTROSE IN LACTATED RINGERS 5 % IV SOLN: Freq: Once | INTRAVENOUS | Status: DC

## 2014-11-27 MED ORDER — MEPERIDINE HCL 50 MG/ML IJ SOLN
INTRAMUSCULAR | Status: DC | PRN
  Administered 2014-11-27 (×4): 25 mg via INTRAVENOUS

## 2014-11-27 NOTE — H&P (Signed)
[name] is an 59 y.o. female.   Chief Complaint: Asian is here for colonoscopy. HPI: Patient is 59 year old Caucasian female who has noted change in her bowel habits and undergoing diagnostic colonoscopy. She says she used to have 3-4 bowel movements per day and then she changed to having no bowel movement for few days. She denies rectal bleeding or abdominal pain. She has lost 65 pounds voluntarily since she was diagnosed with diabetes mellitus. Last colonoscopy was about 2 years ago with removal of single small polyp which was hyperplastic. She has been treated for diverticulitis in the past. Family history is significant for CRC and father who was in his 20s at the time of diagnosis and his disease. Her father had 6 siblings and all of them developed colon cancer. 5 of them are still living. Family history is negative for Lynch syndrome.  Past Medical History  Diagnosis Date  . Thyroid disease   . Diverticulitis     Past Surgical History  Procedure Laterality Date  . Cholecystectomy    . Appendectomy    . Abdominal hysterectomy    . Colonoscopy w/ polypectomy      2014 Dr. Fonda Rios    History reviewed. No pertinent family history. Social History:  reports that she quit smoking about 9 years ago. Her smoking use included Cigarettes. She has a 15 pack-year smoking history. She has never used smokeless tobacco. She reports that she does not drink alcohol or use illicit drugs.  Allergies:  Allergies  Allergen Reactions  . Morphine And Related Nausea And Vomiting    Medications Prior to Admission  Medication Sig Dispense Refill  . aspirin EC 81 MG tablet Take 81 mg by mouth daily.    . cholecalciferol (VITAMIN D) 1000 UNITS tablet Take 2,000 Units by mouth daily.     . [employer] FIBER GUMMIES PO Take 2 tablets by mouth daily.     Marland Kitchen docusate sodium (COLACE) 100 MG capsule Take 100 mg by mouth 2 (two) times daily.    Marland Kitchen levothyroxine (SYNTHROID, LEVOTHROID) 125 MCG tablet Take 125  mcg by mouth daily before breakfast.    . Lutein 20 MG TABS Take 20 mg by mouth daily.    . Multiple Vitamin (MULTIVITAMIN WITH MINERALS) TABS Take 1 tablet by mouth daily.    . polyethylene glycol-electrolytes (NULYTELY/GOLYTELY) 420 G solution Take 4,000 mLs by mouth once. 4000 mL 0  . levothyroxine (SYNTHROID, LEVOTHROID) 175 MCG tablet Take 125 mcg by mouth daily before breakfast.       No results found for this or any previous visit (from the past 48 hour(s)). No results found.  ROS  Blood pressure 143/86, pulse 79, temperature 97.6 F (36.4 C), temperature source Oral, resp. rate 12, height  (1.626 m), weight 192 lb (87.091 kg), SpO2 99 %. Physical Exam  Constitutional: She appears well-developed and well-nourished.  HENT:  Mouth/Throat: Oropharynx is clear and moist.  Eyes: Conjunctivae are normal. No scleral icterus.  Neck: No thyromegaly present.  Cardiovascular: Normal rate, regular rhythm and normal heart sounds.   No murmur heard. Respiratory: Effort normal and breath sounds normal.  GI: Soft. She exhibits no distension and no mass. There is no tenderness.  Musculoskeletal: She exhibits no edema.  Lymphadenopathy:    She has no cervical adenopathy.  Neurological: She is alert.  Skin: Skin is warm and dry.     Assessment/Plan Change in bowel habits. Suspect she may have irritable bowel syndrome and she has flipped  from diarrhea to constipation site. Given very strong family history need to rule out colonic neoplasm or stricture. Strong family history of CRC(father and his 6 siblings). Diagnostic colonoscopy.  [name] U 11/27/2014, 2:36 PM

## 2014-11-27 NOTE — Discharge Instructions (Signed)
Resume usual medications and high fiber diet. No driving for 24 hours. Next screening exam in 5 years. Please remember to do family chart and bring it to the office. PATIENT INSTRUCTIONS POST-ANESTHESIA  IMMEDIATELY FOLLOWING SURGERY:  Do not drive or operate machinery for the first twenty four hours after surgery.  Do not make any important decisions for twenty four hours after surgery or while taking narcotic pain medications or sedatives.  If you develop intractable nausea and vomiting or a severe headache please notify your doctor immediately.  FOLLOW-UP:  Please make an appointment with your surgeon as instructed. You do not need to follow up with anesthesia unless specifically instructed to do so.  WOUND CARE INSTRUCTIONS (if applicable):  Keep a dry clean dressing on the anesthesia/puncture wound site if there is drainage.  Once the wound has quit draining you may leave it open to air.  Generally you should leave the bandage intact for twenty four hours unless there is drainage.  If the epidural site drains for more than 36-48 hours please call the anesthesia department.  QUESTIONS?:  Please feel free to call your physician or the hospital operator if you have any questions, and they will be happy to assist you.     Diverticulitis Diverticulitis is inflammation or infection of small pouches in your colon that form when you have a condition called diverticulosis. The pouches in your colon are called diverticula. Your colon, or large intestine, is where water is absorbed and stool is formed. Complications of diverticulitis can include:  Bleeding.  Severe infection.  Severe pain.  Perforation of your colon.  Obstruction of your colon. CAUSES  Diverticulitis is caused by bacteria. Diverticulitis happens when stool becomes trapped in diverticula. This allows bacteria to grow in the diverticula, which can lead to inflammation and infection. RISK FACTORS People with diverticulosis are  at risk for diverticulitis. Eating a diet that does not include enough fiber from fruits and vegetables may make diverticulitis more likely to develop. SYMPTOMS  Symptoms of diverticulitis may include:  Abdominal pain and tenderness. The pain is normally located on the left side of the abdomen, but may occur in other areas.  Fever and chills.  Bloating.  Cramping.  Nausea.  Vomiting.  Constipation.  Diarrhea.  Blood in your stool. DIAGNOSIS  Your health care provider will ask you about your medical history and do a physical exam. You may need to have tests done because many medical conditions can cause the same symptoms as diverticulitis. Tests may include:  Blood tests.  Urine tests.  Imaging tests of the abdomen, including X-rays and CT scans. When your condition is under control, your health care provider may recommend that you have a colonoscopy. A colonoscopy can show how severe your diverticula are and whether something else is causing your symptoms. TREATMENT  Most cases of diverticulitis are mild and can be treated at home. Treatment may include:  Taking over-the-counter pain medicines.  Following a clear liquid diet.  Taking antibiotic medicines by mouth for 7-10 days. More severe cases may be treated at a hospital. Treatment may include:  Not eating or drinking.  Taking prescription pain medicine.  Receiving antibiotic medicines through an IV tube.  Receiving fluids and nutrition through an IV tube.  Surgery. HOME CARE INSTRUCTIONS   Follow your health care provider's instructions carefully.  Follow a full liquid diet or other diet as directed by your health care provider. After your symptoms improve, your health care provider may tell you  to change your diet. He or she may recommend you eat a high-fiber diet. Fruits and vegetables are good sources of fiber. Fiber makes it easier to pass stool.  Take fiber supplements or probiotics as directed by your  health care provider.  Only take medicines as directed by your health care provider.  Keep all your follow-up appointments. SEEK MEDICAL CARE IF:   Your pain does not improve.  You have a hard time eating food.  Your bowel movements do not return to normal. SEEK IMMEDIATE MEDICAL CARE IF:   Your pain becomes worse.  Your symptoms do not get better.  Your symptoms suddenly get worse.  You have a fever.  You have repeated vomiting.  You have bloody or black, tarry stools. MAKE SURE YOU:   Understand these instructions.  Will watch your condition.  Will get help right away if you are not doing well or get worse. Document Released: 03/02/2005 Document Revised: 05/28/2013 Document Reviewed: 04/17/2013 [facility] Patient Information 2015 [place], [place]. This information is not intended to replace advice given to you by your health care provider. Make sure you discuss any questions you have with your health care provider.

## 2014-11-27 NOTE — Op Note (Signed)
COLONOSCOPY PROCEDURE REPORT  PATIENT:  [name]  MR#:  [id number] Birthdate:  Aug 26, 1955, 59 y.o., female Endoscopist:  Dr. Malissa Hippo, MD Referred By:  Dr. Colette Ribas, MD  Procedure Date: 11/27/2014  Procedure:   Colonoscopy  Indications:  Patient is 59 year old Caucasian female who presents with change in her bowel habits. She is to have 3-4 bowel movements per day and now she would go a few days without a bowel movement. Last colonoscopy was 2 years ago with removal of single polyp which is hypoplastic. Family is very significant for CRC. Father was diagnosed with colon carcinoma in his 80s and is diseased and he had 6 siblings and they all had colon cancer. Five are still living.  Informed Consent:  The procedure and risks were reviewed with the patient and informed consent was obtained.  Medications:  Demerol 100 mg IV Versed 10 mg IV  Description of procedure:  After a digital rectal exam was performed, that colonoscope was advanced from the anus through the rectum and colon to the area of the cecum, ileocecal valve and appendiceal orifice. The cecum was deeply intubated. These structures were well-seen and photographed for the record. From the level of the cecum and ileocecal valve, the scope was slowly and cautiously withdrawn. The mucosal surfaces were carefully surveyed utilizing scope tip to flexion to facilitate fold flattening as needed. The scope was pulled down into the rectum where a thorough exam including retroflexion was performed. Slim scope was used to complete the procedure.  Findings:   Prep satisfactory. Multiple diverticula noted at sigmoid colon with few more at descending colon. Normal rectal mucosa and anorectal junction.   Therapeutic/Diagnostic Maneuvers Performed:   None  Complications:  None  Cecal Withdrawal Time:  7  minutes  Impression:  Examination performed to cecum. Multiple diverticula at sigmoid colon along with few more at  descending colon. No evidence of colonic polyps or neoplasm.  Recommendations:  Standard instructions given. Patient will continue high fiber diet and stool softener. Next screening colonoscopy in 5 years. Patient also advised to make family tree or family chart and bring to office.  REHMAN,NAJEEB U  11/27/2014 3:40 PM  CC: Dr. Phillips Odor, Chancy Hurter, MD & Dr. Bonnetta Barry ref. provider found

## 2014-11-28 ENCOUNTER — Telehealth (INDEPENDENT_AMBULATORY_CARE_PROVIDER_SITE_OTHER): Payer: Self-pay | Admitting: *Deleted

## 2014-11-28 NOTE — Telephone Encounter (Signed)
Forwarded information to Dr.Rehman. Patient had procedure on 11/27/14.

## 2014-11-28 NOTE — Telephone Encounter (Signed)
[name] is calling back with the information requested by Dr. Karilyn Cota. Her Dad, brothers and sisters had Colon Cancer in their early 63's. They are now in their 70's and one is in the 90's. She also would like to know if it is safe and okay to take Colace 1 to 2 tablets daily? The return phone number is [phone].

## 2014-11-30 NOTE — Telephone Encounter (Signed)
Call returned. Ration can take fiber supplement and stool softener or constipation.

## 2014-12-01 ENCOUNTER — Encounter (HOSPITAL_COMMUNITY): Payer: Self-pay | Admitting: Internal Medicine

## 2014-12-01 ENCOUNTER — Ambulatory Visit (HOSPITAL_COMMUNITY)
Admission: RE | Admit: 2014-12-01 | Discharge: 2014-12-01 | Disposition: A | Discharge status: Home / Self Care | Payer: Managed Care, Other (non HMO) | Source: Ambulatory Visit | Attending: Family Medicine | Admitting: Family Medicine

## 2014-12-01 DIAGNOSIS — Z1231 Encounter for screening mammogram for malignant neoplasm of breast: Secondary | ICD-10-CM | POA: Diagnosis present

## 2015-01-06 ENCOUNTER — Emergency Department (HOSPITAL_COMMUNITY)
Admission: EM | Admit: 2015-01-06 | Discharge: 2015-01-06 | Disposition: A | Discharge status: Home / Self Care | Payer: Managed Care, Other (non HMO) | Attending: Emergency Medicine | Admitting: Emergency Medicine

## 2015-01-06 ENCOUNTER — Encounter (HOSPITAL_COMMUNITY): Payer: Self-pay | Admitting: Cardiology

## 2015-01-06 DIAGNOSIS — Y9389 Activity, other specified: Secondary | ICD-10-CM | POA: Diagnosis not present

## 2015-01-06 DIAGNOSIS — Z8719 Personal history of other diseases of the digestive system: Secondary | ICD-10-CM | POA: Insufficient documentation

## 2015-01-06 DIAGNOSIS — Y9289 Other specified places as the place of occurrence of the external cause: Secondary | ICD-10-CM | POA: Diagnosis not present

## 2015-01-06 DIAGNOSIS — Z87891 Personal history of nicotine dependence: Secondary | ICD-10-CM | POA: Insufficient documentation

## 2015-01-06 DIAGNOSIS — S060X0A Concussion without loss of consciousness, initial encounter: Secondary | ICD-10-CM | POA: Diagnosis not present

## 2015-01-06 DIAGNOSIS — W208XXA Other cause of strike by thrown, projected or falling object, initial encounter: Secondary | ICD-10-CM | POA: Diagnosis not present

## 2015-01-06 DIAGNOSIS — E119 Type 2 diabetes mellitus without complications: Secondary | ICD-10-CM | POA: Insufficient documentation

## 2015-01-06 DIAGNOSIS — Z79899 Other long term (current) drug therapy: Secondary | ICD-10-CM | POA: Insufficient documentation

## 2015-01-06 DIAGNOSIS — Y998 Other external cause status: Secondary | ICD-10-CM | POA: Insufficient documentation

## 2015-01-06 DIAGNOSIS — Z8639 Personal history of other endocrine, nutritional and metabolic disease: Secondary | ICD-10-CM | POA: Insufficient documentation

## 2015-01-06 DIAGNOSIS — S0083XA Contusion of other part of head, initial encounter: Secondary | ICD-10-CM | POA: Insufficient documentation

## 2015-01-06 DIAGNOSIS — S0990XA Unspecified injury of head, initial encounter: Secondary | ICD-10-CM | POA: Diagnosis present

## 2015-01-06 DIAGNOSIS — S0093XA Contusion of unspecified part of head, initial encounter: Secondary | ICD-10-CM

## 2015-01-06 DIAGNOSIS — Z7982 Long term (current) use of aspirin: Secondary | ICD-10-CM | POA: Insufficient documentation

## 2015-01-06 HISTORY — DX: Type 2 diabetes mellitus without complications: E11.9

## 2015-01-06 NOTE — Discharge Instructions (Signed)
If you were given medicines take as directed.  If you are on coumadin or contraceptives realize their levels and effectiveness is altered by many different medicines.  If you have any reaction (rash, tongues swelling, other) to the medicines stop taking and see a physician.    If your blood pressure was elevated in the ER make sure you follow up for management with a primary doctor or return for chest pain, shortness of breath or stroke symptoms.  Please follow up as directed and return to the ER or see a physician for new or worsening symptoms.  Thank you. Filed Vitals:   01/06/15 1032  BP: 156/74  Pulse: 65  Temp: 97.5 F (36.4 C)  TempSrc: Oral  Resp: 18  Height: 5\' 4"  (1.626 m)  Weight: 193 lb (87.544 kg)  SpO2: 100%    Concussion A concussion, or closed-head injury, is a brain injury caused by a direct blow to the head or by a quick and sudden movement (jolt) of the head or neck. Concussions are usually not life-threatening. Even so, the effects of a concussion can be serious. If you have had a concussion before, you are more likely to experience concussion-like symptoms after a direct blow to the head.  CAUSES  Direct blow to the head, such as from running into another player during a soccer game, being hit in a fight, or hitting your head on a hard surface.  A jolt of the head or neck that causes the brain to move back and forth inside the skull, such as in a car crash. SIGNS AND SYMPTOMS The signs of a concussion can be hard to notice. Early on, they may be missed by you, family members, and health care providers. You may look fine but act or feel differently. Symptoms are usually temporary, but they may last for days, weeks, or even longer. Some symptoms may appear right away while others may not show up for hours or days. Every head injury is different. Symptoms include:  Mild to moderate headaches that will not go away.  A feeling of pressure inside your head.  Having more  trouble than usual:  Learning or remembering things you have heard.  Answering questions.  Paying attention or concentrating.  Organizing daily tasks.  Making decisions and solving problems.  Slowness in thinking, acting or reacting, speaking, or reading.  Getting lost or being easily confused.  Feeling tired all the time or lacking energy (fatigued).  Feeling drowsy.  Sleep disturbances.  Sleeping more than usual.  Sleeping less than usual.  Trouble falling asleep.  Trouble sleeping (insomnia).  Loss of balance or feeling lightheaded or dizzy.  Nausea or vomiting.  Numbness or tingling.  Increased sensitivity to:  Sounds.  Lights.  Distractions.  Vision problems or eyes that tire easily.  Diminished sense of taste or smell.  Ringing in the ears.  Mood changes such as feeling sad or anxious.  Becoming easily irritated or angry for little or no reason.  Lack of motivation.  Seeing or hearing things other people do not see or hear (hallucinations). DIAGNOSIS Your health care provider can usually diagnose a concussion based on a description of your injury and symptoms. He or she will ask whether you passed out (lost consciousness) and whether you are having trouble remembering events that happened right before and during your injury. Your evaluation might include:  A brain scan to look for signs of injury to the brain. Even if the test shows no injury,  you may still have a concussion.  Blood tests to be sure other problems are not present. TREATMENT  Concussions are usually treated in an emergency department, in urgent care, or at a clinic. You may need to stay in the hospital overnight for further treatment.  Tell your health care provider if you are taking any medicines, including prescription medicines, over-the-counter medicines, and natural remedies. Some medicines, such as blood thinners (anticoagulants) and aspirin, may increase the chance of  complications. Also tell your health care provider whether you have had alcohol or are taking illegal drugs. This information may affect treatment.  Your health care provider will send you home with important instructions to follow.  How fast you will recover from a concussion depends on many factors. These factors include how severe your concussion is, what part of your brain was injured, your age, and how healthy you were before the concussion.  Most people with mild injuries recover fully. Recovery can take time. In general, recovery is slower in older persons. Also, persons who have had a concussion in the past or have other medical problems may find that it takes longer to recover from their current injury. HOME CARE INSTRUCTIONS General Instructions  Carefully follow the directions your health care provider gave you.  Only take over-the-counter or prescription medicines for pain, discomfort, or fever as directed by your health care provider.  Take only those medicines that your health care provider has approved.  Do not drink alcohol until your health care provider says you are well enough to do so. Alcohol and certain other drugs may slow your recovery and can put you at risk of further injury.  If it is harder than usual to remember things, write them down.  If you are easily distracted, try to do one thing at a time. For example, do not try to watch TV while fixing dinner.  Talk with family members or close friends when making important decisions.  Keep all follow-up appointments. Repeated evaluation of your symptoms is recommended for your recovery.  Watch your symptoms and tell others to do the same. Complications sometimes occur after a concussion. Older adults with a brain injury may have a higher risk of serious complications, such as a blood clot on the brain.  Tell your teachers, school nurse, school counselor, coach, athletic trainer, or work Production designer, theatre/television/film about your injury,  symptoms, and restrictions. Tell them about what you can or cannot do. They should watch for:  Increased problems with attention or concentration.  Increased difficulty remembering or learning new information.  Increased time needed to complete tasks or assignments.  Increased irritability or decreased ability to cope with stress.  Increased symptoms.  Rest. Rest helps the brain to heal. Make sure you:  Get plenty of sleep at night. Avoid staying up late at night.  Keep the same bedtime hours on weekends and weekdays.  Rest during the day. Take daytime naps or rest breaks when you feel tired.  Limit activities that require a lot of thought or concentration. These include:  Doing homework or job-related work.  Watching TV.  Working on the computer.  Avoid any situation where there is potential for another head injury (football, hockey, soccer, basketball, martial arts, downhill snow sports and horseback riding). Your condition will get worse every time you experience a concussion. You should avoid these activities until you are evaluated by the appropriate follow-up health care providers. Returning To Your Regular Activities You will need to return to your normal  activities slowly, not all at once. You must give your body and brain enough time for recovery.  Do not return to sports or other athletic activities until your health care provider tells you it is safe to do so.  Ask your health care provider when you can drive, ride a bicycle, or operate heavy machinery. Your ability to react may be slower after a brain injury. Never do these activities if you are dizzy.  Ask your health care provider about when you can return to work or school. Preventing Another Concussion It is very important to avoid another brain injury, especially before you have recovered. In rare cases, another injury can lead to permanent brain damage, brain swelling, or death. The risk of this is greatest  during the first 7-10 days after a head injury. Avoid injuries by:  Wearing a seat belt when riding in a car.  Drinking alcohol only in moderation.  Wearing a helmet when biking, skiing, skateboarding, skating, or doing similar activities.  Avoiding activities that could lead to a second concussion, such as contact or recreational sports, until your health care provider says it is okay.  Taking safety measures in your home.  Remove clutter and tripping hazards from floors and stairways.  Use grab bars in bathrooms and handrails by stairs.  Place non-slip mats on floors and in bathtubs.  Improve lighting in dim areas. SEEK MEDICAL CARE IF:  You have increased problems paying attention or concentrating.  You have increased difficulty remembering or learning new information.  You need more time to complete tasks or assignments than before.  You have increased irritability or decreased ability to cope with stress.  You have more symptoms than before. Seek medical care if you have any of the following symptoms for more than 2 weeks after your injury:  Lasting (chronic) headaches.  Dizziness or balance problems.  Nausea.  Vision problems.  Increased sensitivity to noise or light.  Depression or mood swings.  Anxiety or irritability.  Memory problems.  Difficulty concentrating or paying attention.  Sleep problems.  Feeling tired all the time. SEEK IMMEDIATE MEDICAL CARE IF:  You have severe or worsening headaches. These may be a sign of a blood clot in the brain.  You have weakness (even if only in one hand, leg, or part of the face).  You have numbness.  You have decreased coordination.  You vomit repeatedly.  You have increased sleepiness.  One pupil is larger than the other.  You have convulsions.  You have slurred speech.  You have increased confusion. This may be a sign of a blood clot in the brain.  You have increased restlessness, agitation,  or irritability.  You are unable to recognize people or places.  You have neck pain.  It is difficult to wake you up.  You have unusual behavior changes.  You lose consciousness. MAKE SURE YOU:  Understand these instructions.  Will watch your condition.  Will get help right away if you are not doing well or get worse. Document Released: 08/13/2003 Document Revised: 05/28/2013 Document Reviewed: 12/13/2012 [facility] Patient Information 2015 [place], [place]. This information is not intended to replace advice given to you by your health care provider. Make sure you discuss any questions you have with your health care provider.

## 2015-01-06 NOTE — ED Notes (Signed)
Hit in head this morning with a iron curtain rod.  Bruising to right side of forehead.  Pt states she was nauseated.  States she feels "foolish headed."

## 2015-01-06 NOTE — ED Provider Notes (Signed)
CSN: [id number]     Arrival date & time 01/06/15  1023 History  This chart was scribed for Blane Ohara, MD by Littie Deeds, ED Scribe. This patient was seen in room APA06/AP[facility]06 and the patient's care was started at 10:35 AM.       Chief Complaint  Patient presents with  . Head Injury   Patient is a 59 y.o. female presenting with head injury. The history is provided by the patient. No language interpreter was used.  Head Injury Time since incident:  2 hours Mechanism of injury: direct blow   Pain details:    Duration:  2 hours Chronicity:  New Relieved by:  Ice Associated symptoms: nausea and numbness   Associated symptoms: no focal weakness and no vomiting    HPI Comments: [name] is a 59 y.o. female with a history of DM and thyroid disease who presents to the Emergency Department complaining of a head injury that occurred this morning. Patient states a large curtain rod fell and hit her on the right side of her head. She reports having associated nausea, numbness to the right side of her head, eye pain, right temple pain, and dizziness. She has applied ice to the area. Patient denies vomiting, confusion, weakness, numbness, and slurred speech. She also denies smoking, being on blood thinners, and history of CVA.   Past Medical History  Diagnosis Date  . Thyroid disease   . Diverticulitis   . Diabetes mellitus without complication    Past Surgical History  Procedure Laterality Date  . Cholecystectomy    . Appendectomy    . Abdominal hysterectomy    . Colonoscopy w/ polypectomy      2014 Dr. Fonda Kinder  . Colonoscopy N/A 11/27/2014    Procedure: COLONOSCOPY;  Surgeon: Malissa Hippo, MD;  Location: [facility];  Service: Endoscopy;  Laterality: N/A;  220 - moved to 1:55 - Ann to notify pt   History reviewed. No pertinent family history. History  Substance Use Topics  . Smoking status: Former Smoker -- 1.00 packs/day for 15 years    Types: Cigarettes    Quit date:  06/06/2005  . Smokeless tobacco: Never Used  . Alcohol Use: No   OB History    No data available     Review of Systems  Eyes: Positive for pain.  Gastrointestinal: Positive for nausea. Negative for vomiting.  Skin: Positive for wound.  Neurological: Positive for dizziness and numbness. Negative for focal weakness, speech difficulty and weakness.  Psychiatric/Behavioral: Negative for confusion.      Allergies  Morphine and related  Home Medications   Prior to Admission medications   Medication Sig Start Date End Date Taking? Authorizing Provider  aspirin EC 81 MG tablet Take 81 mg by mouth daily.   Yes Historical Provider, MD  cholecalciferol (VITAMIN D) 1000 UNITS tablet Take 2,000 Units by mouth daily.    Yes Historical Provider, MD  [employer] FIBER GUMMIES PO Take 2 tablets by mouth daily.    Yes Historical Provider, MD  docusate sodium (COLACE) 100 MG capsule Take 100 mg by mouth 2 (two) times daily.   Yes Historical Provider, MD  levothyroxine (SYNTHROID, LEVOTHROID) 125 MCG tablet Take 125 mcg by mouth daily before breakfast.   Yes Historical Provider, MD  Lutein 20 MG TABS Take 20 mg by mouth daily.   Yes Historical Provider, MD  Multiple Vitamin (MULTIVITAMIN WITH MINERALS) TABS Take 1 tablet by mouth daily.   Yes Historical Provider, MD  BP 156/74 mmHg  Pulse 65  Temp(Src) 97.5 F (36.4 C) (Oral)  Resp 18  Ht  (1.626 m)  Wt 193 lb (87.544 kg)  BMI 33.11 kg/m2  SpO2 100% Physical Exam  Constitutional: She is oriented to person, place, and time. She appears well-developed and well-nourished. No distress.  HENT:  Mouth/Throat: Oropharynx is clear and moist. No oropharyngeal exudate.  Mild contusion right forehead  Eyes: Pupils are equal, round, and reactive to light.  Neck: Neck supple.  Cardiovascular: Normal rate.   Pulmonary/Chest: Effort normal.  Musculoskeletal: She exhibits no edema.  Neurological: She is alert and oriented to person, place, and time. No  cranial nerve deficit.  No facial droop. Grossly equal strength in extremities. 5+ strength in UE and LE with f/e at major joints. Sensation to palpation intact in UE and LE. CNs 2-12 grossly intact.  EOMFI.  PERRL.   Finger nose and coordination intact bilateral.   Visual fields intact to finger testing.   Skin: Skin is warm and dry. No rash noted.  Psychiatric: She has a normal mood and affect. Her behavior is normal.  Nursing note and vitals reviewed.   ED Course  Procedures  DIAGNOSTIC STUDIES: Oxygen Saturation is 100% on room air, normal by my interpretation.    COORDINATION OF CARE: 10:41 AM-Discussed treatment plan which includes [place] of heart with patient/guardian at bedside and patient/guardian agreed to plan.    Labs Review Labs Reviewed - No data to display  Imaging Review No results found.   EKG Interpretation None      MDM   Final diagnoses:  Concussion, without loss of consciousness, initial encounter  Head contusion, initial encounter   Patient presents with low risk head injury, normal neuro exam, no anticoagulates. Patient well-appearing on exam. Patient safe for outpatient follow.  Results and differential diagnosis were discussed with the patient/parent/guardian. Xrays were independently reviewed by myself.  Close follow up outpatient was discussed, comfortable with the plan.   Medications - No data to display  Filed Vitals:   01/06/15 1032  BP: 156/74  Pulse: 65  Temp: 97.5 F (36.4 C)  TempSrc: Oral  Resp: 18  Height:  (1.626 m)  Weight: 193 lb (87.544 kg)  SpO2: 100%    Final diagnoses:  Concussion, without loss of consciousness, initial encounter  Head contusion, initial encounter      Blane Ohara, MD 01/06/15 1135

## 2015-03-17 IMAGING — US US SOFT TISSUE HEAD/NECK
1 series · 13 of 25 positions shown · non-contrast
Comparison: None.

ADDENDUM:
This case was reviewed in consideration for ultrasound-guided
fine-needle aspiration of a nodule in the right lobe of the thyroid
gland. The case demonstrates diffuse heterogeneity throughout the
thyroid bilaterally. The suspected nodule in the right lobe of the
gland is rather indistinct, and difficult to discretely separate
from the remainder of the heterogeneous gland. This suspected nodule
demonstrates no internal microcalcifications or other suspicious
features, and given the maximal diameter of only 1.4 cm, this in
fact does not meet criteria for ultrasound-guided fine-needle
aspiration at this time. This was discussed with the patient, and
with Dr. Mon, and recommendations were made for a repeat thyroid
ultrasound in 6 months to reassess this lesion.
CLINICAL DATA: Goiter

EXAM:
THYROID ULTRASOUND
TECHNIQUE: Ultrasound examination of the thyroid gland and adjacent soft
tissues was performed.

[Series 1: us soft tissue head/neck · 0.05mm/px · 13 of 41 slices shown]
[im 1/41]
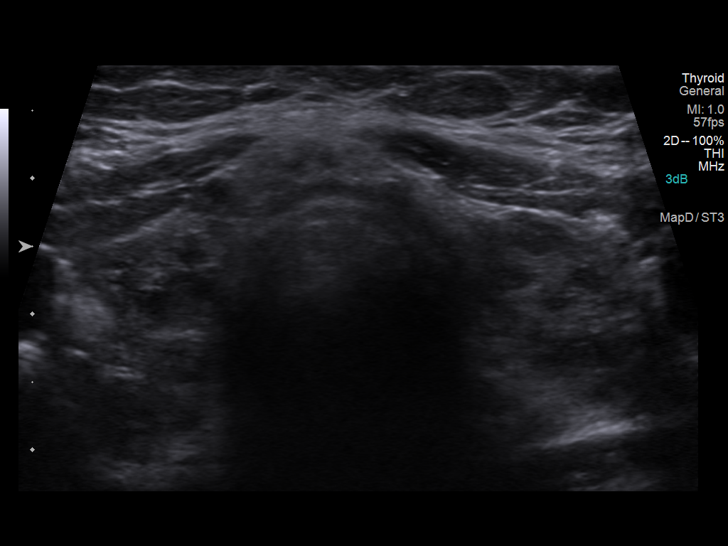
[im 4/41]
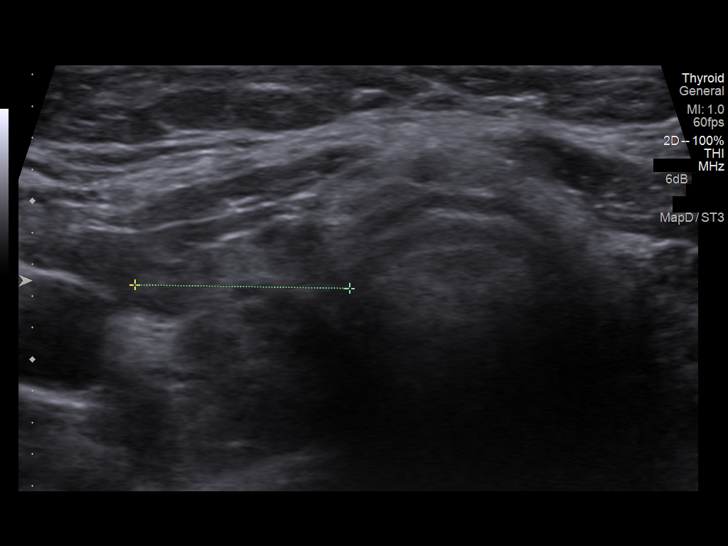
[im 7/41]
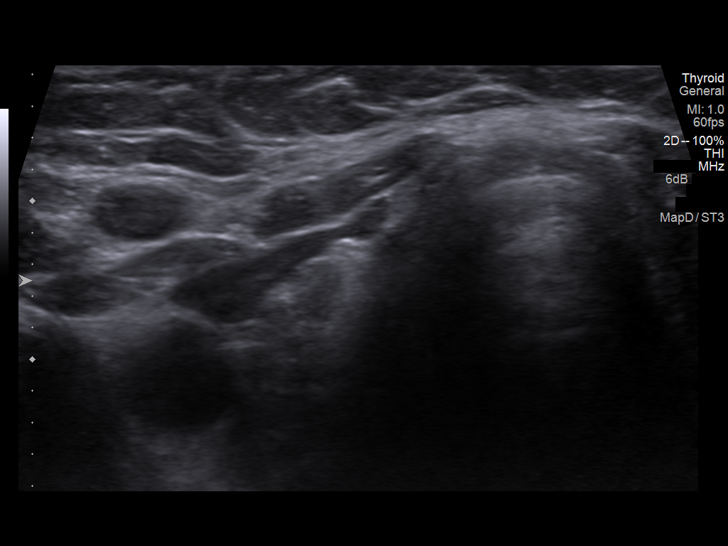
[im 11/41]
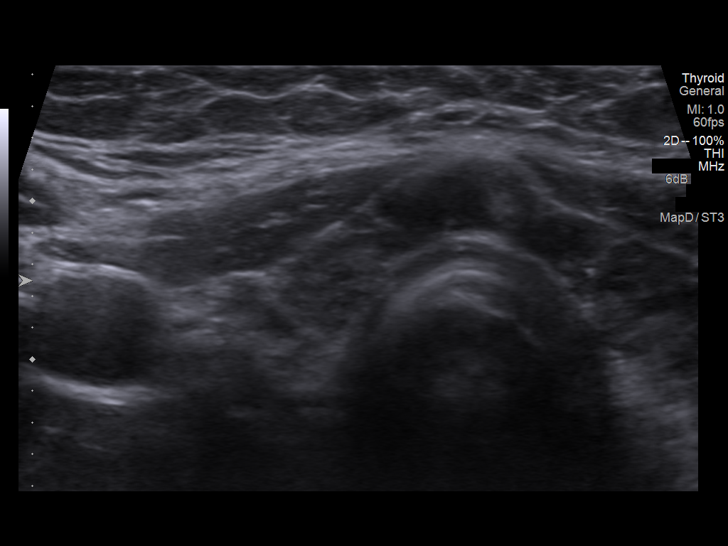
[im 14/41]
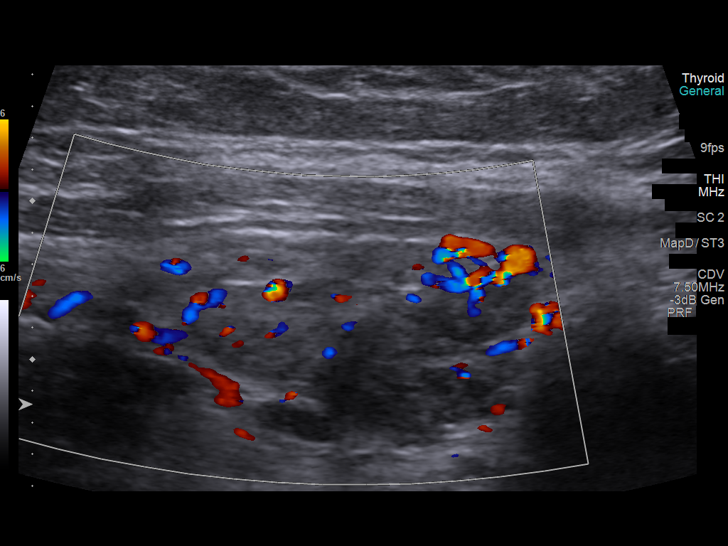
[im 17/41]
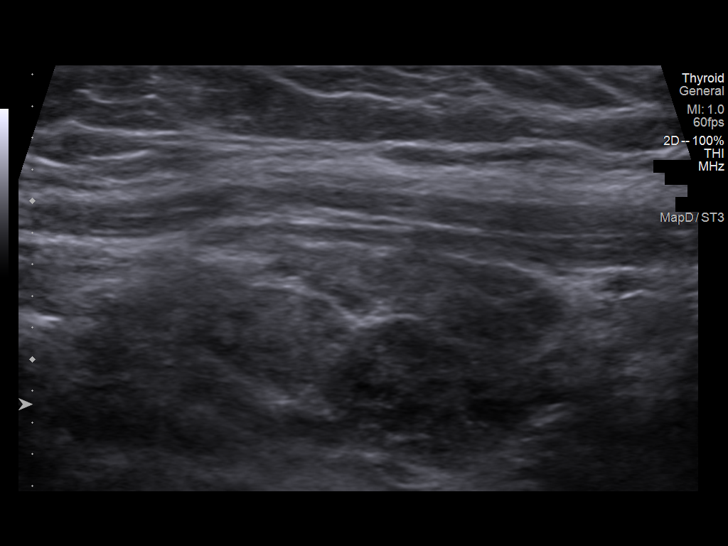
[im 21/41]
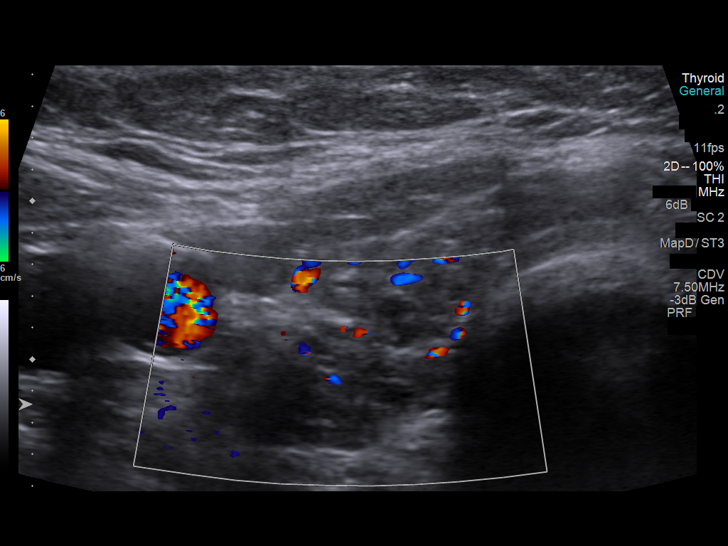
[im 24/41]
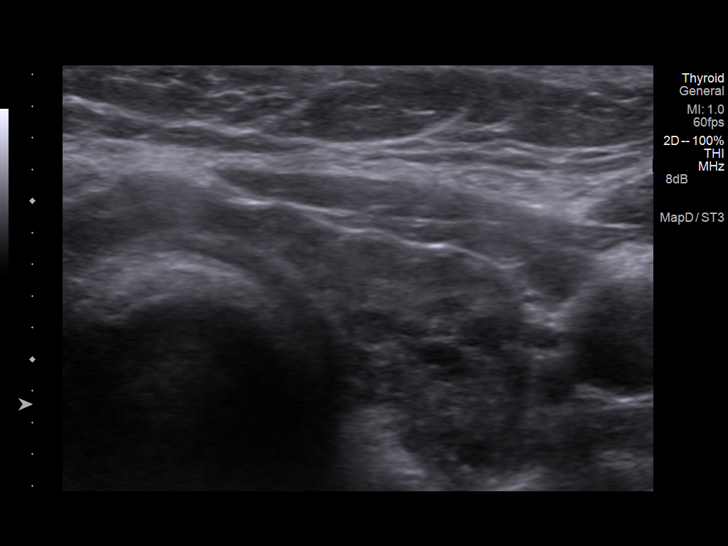
[im 27/41]
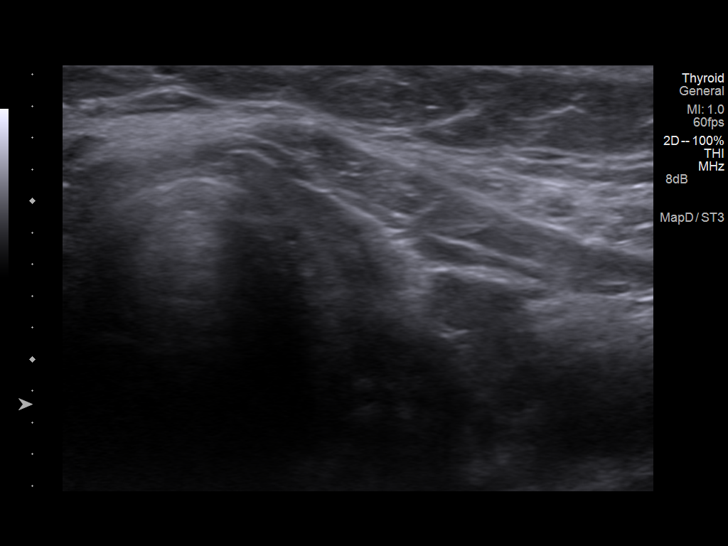
[im 31/41]
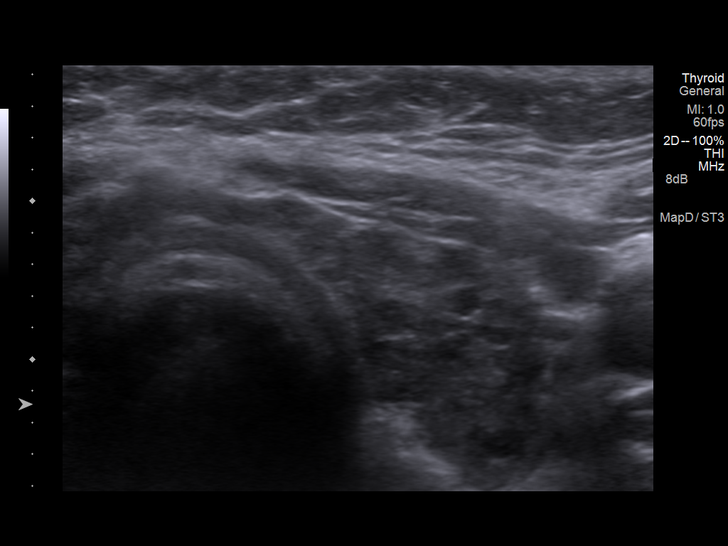
[im 34/41]
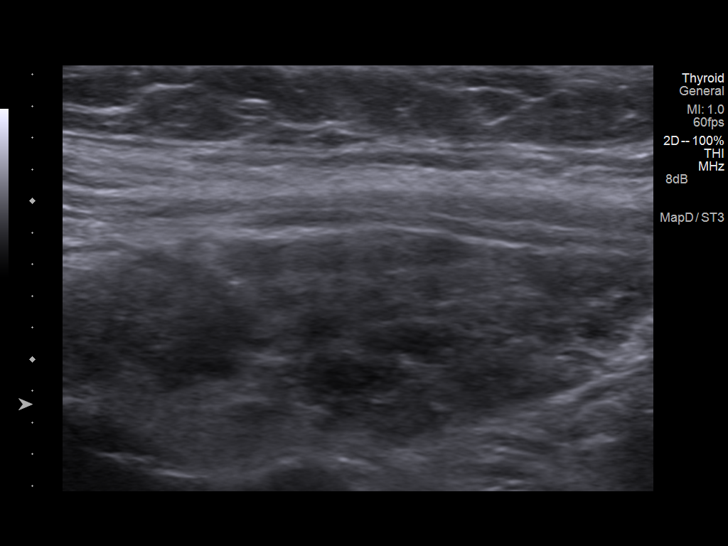
[im 37/41]
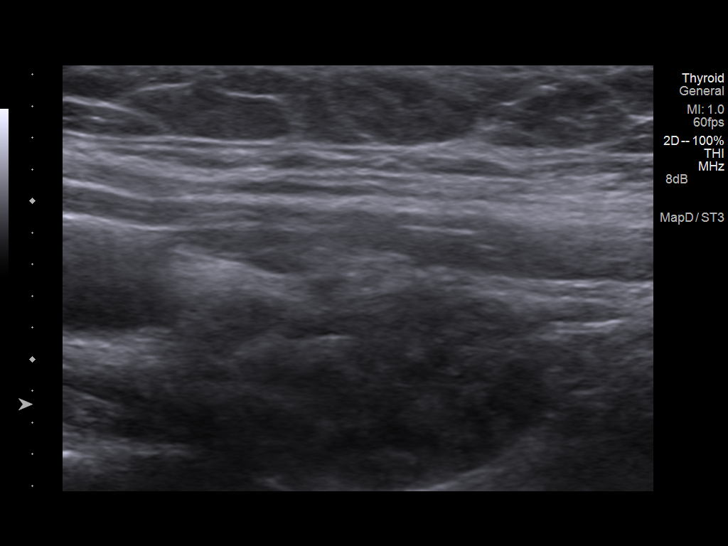
[im 41/41]
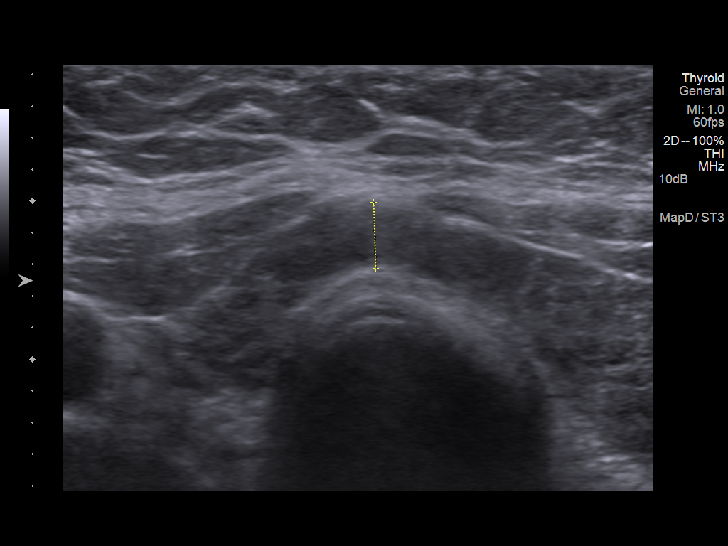

[13 of 25 positions shown; findings below may reference images not displayed]

FINDINGS: Right thyroid lobe

Measurements: 3.6 x 1.3 x 1.4 cm.. Diffuse heterogeneity is noted. A
nodular density is noted posteriorly which measures 14 mm in
greatest dimension.

Left thyroid lobe

Measurements: 3.6 x 1.4 x 1.3 cm.. Generalized heterogeneity is
noted. No discrete nodule is seen.

Isthmus

Thickness: 4 mm..  No nodules visualized.

Lymphadenopathy

None visualized.
IMPRESSION: Changes consistent with a multinodular goiter. A dominant nodule is
noted in the midportion of the right lobe of the thyroid.

Findings meet consensus criteria for biopsy. Ultrasound-guided fine
needle aspiration should be considered, as per the consensus
statement: Management of Thyroid Nodules Detected at US: Society of
Radiologists in Ultrasound Consensus Conference Statement. Radiology

## 2015-07-27 ENCOUNTER — Emergency Department (HOSPITAL_COMMUNITY): Payer: Managed Care, Other (non HMO)

## 2015-07-27 ENCOUNTER — Inpatient Hospital Stay (HOSPITAL_COMMUNITY)
Admission: EM | Admit: 2015-07-27 | Discharge: 2015-07-30 | DRG: 392 | Disposition: A | Discharge status: Home / Self Care | Payer: Managed Care, Other (non HMO) | Attending: Internal Medicine | Admitting: Internal Medicine

## 2015-07-27 ENCOUNTER — Encounter (HOSPITAL_COMMUNITY): Payer: Self-pay | Admitting: *Deleted

## 2015-07-27 DIAGNOSIS — E039 Hypothyroidism, unspecified: Secondary | ICD-10-CM | POA: Diagnosis present

## 2015-07-27 DIAGNOSIS — R1032 Left lower quadrant pain: Secondary | ICD-10-CM | POA: Diagnosis not present

## 2015-07-27 DIAGNOSIS — E669 Obesity, unspecified: Secondary | ICD-10-CM | POA: Diagnosis present

## 2015-07-27 DIAGNOSIS — Z8249 Family history of ischemic heart disease and other diseases of the circulatory system: Secondary | ICD-10-CM | POA: Diagnosis not present

## 2015-07-27 DIAGNOSIS — K5909 Other constipation: Secondary | ICD-10-CM | POA: Diagnosis not present

## 2015-07-27 DIAGNOSIS — E038 Other specified hypothyroidism: Secondary | ICD-10-CM | POA: Diagnosis not present

## 2015-07-27 DIAGNOSIS — Z87891 Personal history of nicotine dependence: Secondary | ICD-10-CM

## 2015-07-27 DIAGNOSIS — K5900 Constipation, unspecified: Secondary | ICD-10-CM | POA: Diagnosis present

## 2015-07-27 DIAGNOSIS — K5720 Diverticulitis of large intestine with perforation and abscess without bleeding: Principal | ICD-10-CM | POA: Diagnosis present

## 2015-07-27 DIAGNOSIS — R350 Frequency of micturition: Secondary | ICD-10-CM | POA: Diagnosis present

## 2015-07-27 DIAGNOSIS — K5792 Diverticulitis of intestine, part unspecified, without perforation or abscess without bleeding: Secondary | ICD-10-CM | POA: Diagnosis present

## 2015-07-27 DIAGNOSIS — R1030 Lower abdominal pain, unspecified: Secondary | ICD-10-CM | POA: Diagnosis present

## 2015-07-27 DIAGNOSIS — R110 Nausea: Secondary | ICD-10-CM

## 2015-07-27 DIAGNOSIS — D72829 Elevated white blood cell count, unspecified: Secondary | ICD-10-CM | POA: Diagnosis not present

## 2015-07-27 DIAGNOSIS — R102 Pelvic and perineal pain: Secondary | ICD-10-CM

## 2015-07-27 DIAGNOSIS — E119 Type 2 diabetes mellitus without complications: Secondary | ICD-10-CM | POA: Diagnosis not present

## 2015-07-27 DIAGNOSIS — R109 Unspecified abdominal pain: Secondary | ICD-10-CM | POA: Diagnosis present

## 2015-07-27 LAB — COMPREHENSIVE METABOLIC PANEL
ALBUMIN: 4 g/dL (ref 3.5–5.0)
ALK PHOS: 39 U/L (ref 38–126)
ALT: 14 U/L (ref 14–54)
AST: 17 U/L (ref 15–41)
Anion gap: 10 (ref 5–15)
BUN: 12 mg/dL (ref 6–20)
CHLORIDE: 105 mmol/L (ref 101–111)
CO2: 25 mmol/L (ref 22–32)
CREATININE: 0.72 mg/dL (ref 0.44–1.00)
Calcium: 9 mg/dL (ref 8.9–10.3)
GFR calc non Af Amer: >60 mL/min (ref >60)
GLUCOSE: 136 mg/dL — AB (ref 65–99)
Potassium: 3.8 mmol/L (ref 3.5–5.1)
Sodium: 140 mmol/L (ref 135–145)
Total Bilirubin: 1.1 mg/dL (ref 0.3–1.2)
Total Protein: 7.2 g/dL (ref 6.5–8.1)

## 2015-07-27 LAB — URINALYSIS, ROUTINE W REFLEX MICROSCOPIC
GLUCOSE, UA: NEGATIVE mg/dL
Hgb urine dipstick: NEGATIVE
KETONES UR: 15 mg/dL — AB
LEUKOCYTES UA: NEGATIVE
Nitrite: NEGATIVE
PH: 5 (ref 5.0–8.0)
Protein, ur: 100 mg/dL — AB

## 2015-07-27 LAB — TSH: TSH: 3.668 u[IU]/mL (ref 0.350–4.500)

## 2015-07-27 LAB — URINE MICROSCOPIC-ADD ON: RBC / HPF: NONE SEEN RBC/hpf (ref 0–5)

## 2015-07-27 LAB — CBC WITH DIFFERENTIAL/PLATELET
BASOS ABS: 0 10*3/uL (ref 0.0–0.1)
BASOS PCT: 0 %
EOS PCT: 0 %
Eosinophils Absolute: 0.1 10*3/uL (ref 0.0–0.7)
HCT: 41.2 % (ref 36.0–46.0)
HEMOGLOBIN: 14 g/dL (ref 12.0–15.0)
LYMPHS ABS: 1.9 10*3/uL (ref 0.7–4.0)
Lymphocytes Relative: 12 %
MCH: 31.7 pg (ref 26.0–34.0)
MCHC: 34 g/dL (ref 30.0–36.0)
MCV: 93.4 fL (ref 78.0–100.0)
Monocytes Absolute: 0.9 10*3/uL (ref 0.1–1.0)
Monocytes Relative: 6 %
Neutro Abs: 13.3 10*3/uL — ABNORMAL HIGH (ref 1.7–7.7)
Neutrophils Relative %: 82 %
Platelets: 222 10*3/uL (ref 150–400)
RBC: 4.41 MIL/uL (ref 3.87–5.11)
RDW: 13.2 % (ref 11.5–15.5)
WBC: 16.2 10*3/uL — ABNORMAL HIGH (ref 4.0–10.5)

## 2015-07-27 LAB — LIPASE, BLOOD: Lipase: 17 U/L (ref 11–51)

## 2015-07-27 LAB — GLUCOSE, CAPILLARY: Glucose-Capillary: 151 mg/dL — ABNORMAL HIGH (ref 65–99)

## 2015-07-27 LAB — LACTIC ACID, PLASMA: Lactic Acid, Venous: 0.9 mmol/L (ref 0.5–2.0)

## 2015-07-27 MED ORDER — LUTEIN 20 MG PO TABS: 20.0000 mg | ORAL_TABLET | Freq: Every day | ORAL | Status: DC

## 2015-07-27 MED ORDER — HYDROMORPHONE HCL 1 MG/ML IJ SOLN
1.0000 mg | INTRAMUSCULAR | Status: DC | PRN
  Administered 2015-07-28: 1 mg via INTRAVENOUS
  Filled 2015-07-27: qty 1

## 2015-07-27 MED ORDER — INSULIN ASPART 100 UNIT/ML ~~LOC~~ SOLN: 0.0000 [IU] | Freq: Three times a day (TID) | SUBCUTANEOUS | Status: DC

## 2015-07-27 MED ORDER — ACETAMINOPHEN 650 MG RE SUPP: 650.0000 mg | Freq: Four times a day (QID) | RECTAL | Status: DC | PRN

## 2015-07-27 MED ORDER — METRONIDAZOLE IN NACL 5-0.79 MG/ML-% IV SOLN: 500.0000 mg | Freq: Once | INTRAVENOUS | Status: DC

## 2015-07-27 MED ORDER — OXYCODONE HCL 5 MG PO TABS
5.0000 mg | ORAL_TABLET | Freq: Four times a day (QID) | ORAL | Status: DC | PRN
  Administered 2015-07-28 (×2): 5 mg via ORAL
  Filled 2015-07-27 (×2): qty 1

## 2015-07-27 MED ORDER — ACETAMINOPHEN 325 MG PO TABS: 650.0000 mg | ORAL_TABLET | Freq: Four times a day (QID) | ORAL | Status: DC | PRN

## 2015-07-27 MED ORDER — HEPARIN SODIUM (PORCINE) 5000 UNIT/ML IJ SOLN
5000.0000 [IU] | Freq: Three times a day (TID) | INTRAMUSCULAR | Status: DC
  Administered 2015-07-28 – 2015-07-29 (×5): 5000 [IU] via SUBCUTANEOUS
  Filled 2015-07-27 (×6): qty 1

## 2015-07-27 MED ORDER — ASPIRIN EC 81 MG PO TBEC
81.0000 mg | DELAYED_RELEASE_TABLET | Freq: Every day | ORAL | Status: DC
  Administered 2015-07-28 – 2015-07-30 (×3): 81 mg via ORAL
  Filled 2015-07-27 (×3): qty 1

## 2015-07-27 MED ORDER — LEVOTHYROXINE SODIUM 100 MCG PO TABS
125.0000 ug | ORAL_TABLET | Freq: Every day | ORAL | Status: DC
  Administered 2015-07-28 – 2015-07-30 (×3): 125 ug via ORAL
  Filled 2015-07-27 (×3): qty 1

## 2015-07-27 MED ORDER — HYDROMORPHONE HCL 1 MG/ML IJ SOLN
1.0000 mg | INTRAMUSCULAR | Status: DC | PRN
  Administered 2015-07-27: 1 mg via INTRAVENOUS
  Filled 2015-07-27: qty 1

## 2015-07-27 MED ORDER — SODIUM CHLORIDE 0.9 % IV SOLN
INTRAVENOUS | Status: DC
  Administered 2015-07-28 – 2015-07-30 (×4): via INTRAVENOUS

## 2015-07-27 MED ORDER — POLYETHYLENE GLYCOL 3350 17 G PO PACK: 17.0000 g | PACK | Freq: Every day | ORAL | Status: DC | PRN

## 2015-07-27 MED ORDER — DOCUSATE SODIUM 100 MG PO CAPS
100.0000 mg | ORAL_CAPSULE | Freq: Two times a day (BID) | ORAL | Status: DC
  Administered 2015-07-28 – 2015-07-30 (×6): 100 mg via ORAL
  Filled 2015-07-27 (×6): qty 1

## 2015-07-27 MED ORDER — ONDANSETRON HCL 4 MG/2ML IJ SOLN
4.0000 mg | Freq: Once | INTRAMUSCULAR | Status: AC
  Administered 2015-07-27: 4 mg via INTRAVENOUS
  Filled 2015-07-27: qty 2

## 2015-07-27 MED ORDER — ALUM & MAG HYDROXIDE-SIMETH 200-200-20 MG/5ML PO SUSP: 30.0000 mL | Freq: Four times a day (QID) | ORAL | Status: DC | PRN

## 2015-07-27 MED ORDER — ONDANSETRON HCL 4 MG PO TABS: 4.0000 mg | ORAL_TABLET | Freq: Four times a day (QID) | ORAL | Status: DC | PRN

## 2015-07-27 MED ORDER — ONDANSETRON HCL 4 MG/2ML IJ SOLN: 4.0000 mg | Freq: Four times a day (QID) | INTRAMUSCULAR | Status: DC | PRN

## 2015-07-27 MED ORDER — CIPROFLOXACIN IN D5W 400 MG/200ML IV SOLN
400.0000 mg | Freq: Once | INTRAVENOUS | Status: AC
  Administered 2015-07-27: 400 mg via INTRAVENOUS
  Filled 2015-07-27: qty 200

## 2015-07-27 NOTE — ED Notes (Signed)
Husband Neha Waight [phone]

## 2015-07-27 NOTE — ED Notes (Addendum)
Intermittent, severe Spasms to suprapubic area which began yesterday. Nausea. Dizziness. Pt states she is unable to eat due to nausea. Pt states pressure when urinating.

## 2015-07-27 NOTE — H&P (Signed)
[facility] History and Physical  [name] WJX:[id number] DOB: Jul 12, 1955 DOA: 07/27/2015  Referring physician: Dr. Rolland Porter. PCP: Colette Ribas, MD   Chief Complaint: Abdominal pain, nausea, anorexia  HPI: [name] is a 61 y.o. female with past medical history significant for thyroid disease, diverticulitis, obesity and diet-controlled diabetes type 2; who presented to the hospital secondary to abdominal pain, nausea and anorexia (No eating anything in the last 24 hours, in fear of ending throwing up) for the last 2 days prior to admissions. Patient reports pain in her left flank and suprapubic area, 8 out of 10 in intensity, as a sitter with nausea and anorexia, colicky in nature and lasting approximately 30-40 minutes at a time. No relieving factors and the patient had no use any Medications Prior to Admission to Assist with Her Complaint. In the ED she was found to be afebrile (MAXIMUM TEMPERATURE 99.2), with elevated wbc's (16.2) and a CT of abdomen and pelvis that demonstrated diverticulitis with microperforation. [facility] has been called to admit the patient for further evaluation and treatment.  Of note, patient endorses some increased frequency and constipation. She denies any fever, chills or hematemesis, melena, hematochezia, headaches, blurry patient, hematuria or any other complaints   Review of Systems:  Negative except as otherwise mentioned in history of present illness.   Past Medical History  Diagnosis Date  . Thyroid disease   . Diverticulitis   . Diabetes mellitus without complication [facility])    Past Surgical History  Procedure Laterality Date  . Cholecystectomy    . Appendectomy    . Abdominal hysterectomy    . Colonoscopy w/ polypectomy      2014 Dr. Fonda Kinder  . Colonoscopy N/A 11/27/2014    Procedure: COLONOSCOPY;  Surgeon: Malissa Hippo, MD;  Location: [facility];  Service: Endoscopy;  Laterality: N/A;  220 - moved to 1:55 -  Ann to notify pt   Social History:  reports that she quit smoking about 10 years ago. Her smoking use included Cigarettes. She has a 15 pack-year smoking history. She has never used smokeless tobacco. She reports that she does not drink alcohol or use illicit drugs.  Allergies  Allergen Reactions  . Morphine And Related Nausea And Vomiting    Family history: Significant for hypertension, otherwise noncontributory.  Prior to Admission medications   Medication Sig Start Date End Date Taking? Authorizing Provider  aspirin EC 81 MG tablet Take 81 mg by mouth daily.   Yes Historical Provider, MD  docusate sodium (COLACE) 100 MG capsule Take 100 mg by mouth 2 (two) times daily.   Yes Historical Provider, MD  levothyroxine (SYNTHROID, LEVOTHROID) 125 MCG tablet Take 125 mcg by mouth daily before breakfast.   Yes Historical Provider, MD  Lutein 20 MG TABS Take 20 mg by mouth daily.   Yes Historical Provider, MD   Physical Exam: Filed Vitals:   07/27/15 1845 07/27/15 2106 07/27/15 2200 07/27/15 2255  BP: 134/87 117/80 126/80 116/65  Pulse: 96 90 89 77  Temp: 99.2 F (37.3 C)     TempSrc: Oral     Resp: Height:  (1.626 m)     SpO2: 96% 96% 99% 97%    Wt Readings from Last 3 Encounters:  01/06/15 87.544 kg (193 lb)  11/27/14 87.091 kg (192 lb)  10/22/14 86.773 kg (191 lb 4.8 oz)    General:  Overweight, calm and without acute distress after receiving Dilaudid  for pain. Patient is afebrile; denying chest pain and shortness of breath. Eyes: PERRL, normal lids, irises & conjunctiva, no icterus, no nystagmus ENT: grossly normal hearing, mild dryness in her oral mucosa, no erythema, no exudates and good dentition; no drainage out of ears or  nostrils. Neck: Supple, no LAD, masses or thyromegaly, no JVD Cardiovascular: RRR, no m/r/g. No LE edema. Respiratory: CTA bilaterally, no w/r/r. Normal respiratory effort. Abdomen: soft, no guarding, no distention, no masses; positive  bowel sounds. Patient with exquisite left lower quadrant pain and positive rebound. Skin: no rash, open wounds or induration seen on exam Musculoskeletal: grossly normal tone BUE/BLE Psychiatric: grossly normal mood and affect, speech fluent and appropriate Neurologic: grossly non-focal.          Labs on Admission:  Basic Metabolic Panel:  Recent Labs Lab 07/27/15 2110  NA 140  K 3.8  CL 105  CO2 25  GLUCOSE 136*  BUN 12  CREATININE 0.72  CALCIUM 9.0   Liver Function Tests:  Recent Labs Lab 07/27/15 2110  AST 17  ALT 14  ALKPHOS 39  BILITOT 1.1  PROT 7.2  ALBUMIN 4.0    Recent Labs Lab 07/27/15 2110  LIPASE 17   CBC:  Recent Labs Lab 07/27/15 2110  WBC 16.2*  NEUTROABS 13.3*  HGB 14.0  HCT 41.2  MCV 93.4  PLT 222    Radiological Exams on Admission: Ct Renal Stone Study  07/27/2015  CLINICAL DATA:  60 year old presenting with a acute onset of severe suprapubic spasms which began yesterday associated with nausea and dysuria. Surgical history includes cholecystectomy, appendectomy and hysterectomy. EXAM: CT ABDOMEN AND PELVIS WITHOUT CONTRAST TECHNIQUE: Multidetector CT imaging of the abdomen and pelvis was performed following the standard protocol without IV contrast. COMPARISON:  05/02/2013, 11/14/2011, 8/1/1,010. FINDINGS: Lower chest:  Heart size normal.  Visualized lung bases clear. Hepatobiliary: Normal unenhanced appearance of the liver. Gallbladder surgically absent. No biliary ductal dilation. Pancreas: Mildly atrophic without evidence of mass or peripancreatic inflammation. Spleen: Normal in size and appearance. Focus of accessory splenic tissue medial to the lower pole. Adrenals/Urinary Tract: Normal appearing adrenal glands. No evidence of urinary tract calculi or obstruction on either side. Within the limits of the unenhanced technique, no focal parenchymal abnormality involving either kidney. Normal-appearing decompressed urinary bladder.  Stomach/Bowel: Stomach decompressed and unremarkable. Normal-appearing small bowel. Diverticulosis involving the sigmoid colon with edema/inflammation surrounding the proximal sigmoid colon. Solitary extraluminal gas bubble. No abnormal fluid collection. Lipoma involving the ileocecal valve. Surgically absent appendix. No ascites. Vascular/Lymphatic: Mild aortoiliac atherosclerosis without aneurysm. No pathologic lymphadenopathy. Reproductive: Surgically absent uterus. No adnexal masses or free pelvic fluid. Other: Phleboliths low in both sides of the pelvis. Musculoskeletal: Moderate degenerative changes in both hips. Chronic L5-S1 disc protrusion with calcification in the posterior annular fibers. Spondylosis involving the lower thoracic and upper lumbar spine. No acute abnormalities. IMPRESSION: 1. Acute diverticulitis involving the proximal sigmoid colon with evidence of contained microperforation. No evidence of abscess. 2. Mild pancreatic atrophy. Electronically Signed   By: Hulan Saas M.D.   On: 07/27/2015 21:20    EKG: None   Assessment/Plan 1-Diverticulitis of colon with perforation: Cause for patient chronic/crampy abdominal pain, with associated nausea and anorexia. -Patient will be admitted to MedSurg bed -Will provide fluid resuscitation, IV antibiotics and will attempt clear liquid diets (discussed with patient pros and cons of doing these but she expressed to me that has been almost 24 hours without eating anything and she will not be able  to tolerate not having at least sips of liquids). -As needed antiemetics and analgesics medications  -Follow intake and output  -Depending on patient's clinical response she might require surgical consultation. -Will check lactic acid  2-abdominal pain/nausea and anorexia: Secondary to problem #1   -Treatment mentioned above -Fluid resuscitation, as needed antiemetics/analgesics  -IV antibiotics  -Lipase negative  3-Obesity:  -Secondary to  excessive caloric intake along with chronic hypothyroidism condition.  -Increase exercise and low-calorie diet discussed with patient.   4-Hypothyroidism: -Will check TSH -Continue Synthroid  5-Diabetes mellitus type 2, diet-controlled (HCC): Currently not taking any medications at home -occasions reported per records review -Will check hemoglobin A1c -While the patient will use sensitive sliding scale and follow her CBGs before every meal and daily at bedtime  6-Leukocytosis: Appears to be secondary to problem #1, along with stress demargination from dehydration. -will provide IVF's resuscitation -IV antibiotics -Will follow WBCs trend in a.m.   7-Constipation: Chronic and intermittent; most likely associated with history of hypothyroidism. -Will continue the use of Colace twice a day and will add as needed miralax    Code Status: Full code DVT Prophylaxis: Heparin Family Communication: Husband at bedside Disposition Plan: inpatient status, LOS > 2 midnights, med-surg bed  Time spent: 60 minutes  Vassie Loll [facility] Pager [phone]

## 2015-07-27 NOTE — ED Provider Notes (Signed)
CSN: [id number]     Arrival date & time 07/27/15  1810 History  By signing my name below, I, Linus Galas, attest that this documentation has been prepared under the direction and in the presence of No att. providers found. Electronically Signed: Linus Galas, ED Scribe. 08/18/2015. 8:15 PM.   Chief Complaint  Patient presents with  . Dysuria   The history is provided by the patient. No language interpreter was used.   HPI Comments: [name] is a 60 y.o. female with a PMHx of Thyroid disease, diverticulitis, kidney stones and DM who presents to the Emergency Department complaining of intermittent "severe" suprapubic abdominal pain with abdominal spasms for the past 2 days. Pt also reports nausea and dysuria. Pt states her pain radiates to her lower back. She reports each episode lasts several minutes with "45 minutes in between each episode". Pt also reports pain when she "walks to the bathroom". She states her pain is similar to kidney stone pain. Pt denies any fevers, chills, or any other symptoms at this time. Pt is allergic to Morphine.   Past Medical History  Diagnosis Date  . Thyroid disease   . Diverticulitis   . Diabetes mellitus without complication [facility])    Past Surgical History  Procedure Laterality Date  . Cholecystectomy    . Appendectomy    . Abdominal hysterectomy    . Colonoscopy w/ polypectomy      2014 Dr. Fonda Kinder  . Colonoscopy N/A 11/27/2014    Procedure: COLONOSCOPY;  Surgeon: Malissa Hippo, MD;  Location[facility];  Service: Endoscopy;  Laterality: N/A;  220 - moved to 1:55 - Ann to notify pt   History reviewed. No pertinent family history. Social History  Substance Use Topics  . Smoking status: Former Smoker -- 1.00 packs/day for 15 years    Types: Cigarettes    Quit date: 06/06/2005  . Smokeless tobacco: Never Used  . Alcohol Use: No   OB History    No data available     Review of Systems  Constitutional: Negative for fever, chills,  diaphoresis, appetite change and fatigue.  HENT: Negative for mouth sores, sore throat and trouble swallowing.   Eyes: Negative for visual disturbance.  Respiratory: Negative for cough, chest tightness, shortness of breath and wheezing.   Cardiovascular: Negative for chest pain.  Gastrointestinal: Positive for nausea and abdominal pain. Negative for vomiting, diarrhea and abdominal distention.  Endocrine: Negative for polydipsia, polyphagia and polyuria.  Genitourinary: Positive for dysuria. Negative for frequency and hematuria.  Musculoskeletal: Negative for gait problem.  Skin: Negative for color change, pallor and rash.  Neurological: Negative for dizziness, syncope, light-headedness and headaches.  Hematological: Does not bruise/bleed easily.  Psychiatric/Behavioral: Negative for behavioral problems and confusion.  All other systems reviewed and are negative.    Allergies  Morphine and related  Home Medications   Prior to Admission medications   Medication Sig Start Date End Date Taking? Authorizing Provider  aspirin EC 81 MG tablet Take 81 mg by mouth daily.   Yes Historical Provider, MD  docusate sodium (COLACE) 100 MG capsule Take 100 mg by mouth 2 (two) times daily.   Yes Historical Provider, MD  levothyroxine (SYNTHROID, LEVOTHROID) 125 MCG tablet Take 125 mcg by mouth daily before breakfast.   Yes Historical Provider, MD  Lutein 20 MG TABS Take 20 mg by mouth daily.   Yes Historical Provider, MD  amoxicillin-clavulanate (AUGMENTIN) 875-125 MG tablet Take 1 tablet by mouth 2 (two) times  daily. 08/13/15   Len Blalock, NP  Melatonin 10 MG TBCR Take by mouth.    Historical Provider, MD   BP 110/71 mmHg  Pulse 66  Temp(Src) 98.7 F (37.1 C) (Oral)  Resp 18  Ht 5\' 4"  (1.626 m)  Wt 207 lb 12.8 oz (94.257 kg)  BMI 35.65 kg/m2  SpO2 100%   Physical Exam  Constitutional: She is oriented to person, place, and time. She appears well-developed and well-nourished. No distress.   HENT:  Head: Normocephalic.  Eyes: Conjunctivae are normal. Pupils are equal, round, and reactive to light. No scleral icterus.  Neck: Normal range of motion. Neck supple. No thyromegaly present.  Cardiovascular: Normal rate and regular rhythm.  Exam reveals no gallop and no friction rub.   No murmur heard. Pulmonary/Chest: Effort normal and breath sounds normal. No respiratory distress. She has no wheezes. She has no rales.  Abdominal: Soft. Bowel sounds are normal. She exhibits no distension. There is no tenderness. There is no rebound.  Suprapubic pain. No tenderness on exam.   Musculoskeletal: Normal range of motion.  Neurological: She is alert and oriented to person, place, and time.  Skin: Skin is warm and dry. No rash noted.  Psychiatric: She has a normal mood and affect. Her behavior is normal.  Nursing note and vitals reviewed.  ED Course  Procedures   DIAGNOSTIC STUDIES: Oxygen Saturation is 96% on room air, normal by my interpretation.    COORDINATION OF CARE: 8:12 PM Will order CT Renal stone study and blood work. Will give zofran and dilaudid injection. Discussed treatment plan with pt at bedside and pt agreed to plan.   Labs Review Labs Reviewed  URINALYSIS, ROUTINE W REFLEX MICROSCOPIC (NOT AT [facility]) - Abnormal; Notable for the following:    APPearance HAZY (*)    Specific Gravity, Urine >1.030 (*)    Bilirubin Urine MODERATE (*)    Ketones, ur 15 (*)    Protein, ur 100 (*)    All other components within normal limits  URINE MICROSCOPIC-ADD ON - Abnormal; Notable for the following:    Squamous Epithelial / LPF TOO NUMEROUS TO COUNT (*)    Bacteria, UA MANY (*)    Casts HYALINE CASTS (*)    All other components within normal limits  CBC WITH DIFFERENTIAL/PLATELET - Abnormal; Notable for the following:    WBC 16.2 (*)    Neutro Abs 13.3 (*)    All other components within normal limits  COMPREHENSIVE METABOLIC PANEL - Abnormal; Notable for the following:     Glucose, Bld 136 (*)    All other components within normal limits  HEMOGLOBIN A1C - Abnormal; Notable for the following:    Hgb A1c MFr Bld 6.2 (*)    All other components within normal limits  CBC - Abnormal; Notable for the following:    WBC 10.6 (*)    All other components within normal limits  BASIC METABOLIC PANEL - Abnormal; Notable for the following:    Glucose, Bld 110 (*)    Calcium 8.5 (*)    All other components within normal limits  GLUCOSE, CAPILLARY - Abnormal; Notable for the following:    Glucose-Capillary 151 (*)    All other components within normal limits  GLUCOSE, CAPILLARY - Abnormal; Notable for the following:    Glucose-Capillary 108 (*)    All other components within normal limits  BASIC METABOLIC PANEL - Abnormal; Notable for the following:    Glucose, Bld 112 (*)  Calcium 8.4 (*)    All other components within normal limits  CBC - Abnormal; Notable for the following:    RBC 3.81 (*)    Hemoglobin 11.9 (*)    All other components within normal limits  GLUCOSE, CAPILLARY - Abnormal; Notable for the following:    Glucose-Capillary 105 (*)    All other components within normal limits  GLUCOSE, CAPILLARY - Abnormal; Notable for the following:    Glucose-Capillary 117 (*)    All other components within normal limits  GLUCOSE, CAPILLARY - Abnormal; Notable for the following:    Glucose-Capillary 104 (*)    All other components within normal limits  LIPASE, BLOOD  LACTIC ACID, PLASMA  TSH  MAGNESIUM  PHOSPHORUS  GLUCOSE, CAPILLARY  GLUCOSE, CAPILLARY  GLUCOSE, CAPILLARY  GLUCOSE, CAPILLARY  GLUCOSE, CAPILLARY  GLUCOSE, CAPILLARY    Imaging Review Ct Renal Stone Study  07/27/2015  CLINICAL DATA:  60 year old presenting with a acute onset of severe suprapubic spasms which began yesterday associated with nausea and dysuria. Surgical history includes cholecystectomy, appendectomy and hysterectomy. EXAM: CT ABDOMEN AND PELVIS WITHOUT CONTRAST  TECHNIQUE: Multidetector CT imaging of the abdomen and pelvis was performed following the standard protocol without IV contrast. COMPARISON:  05/02/2013, 11/14/2011, 8/1/1,010. FINDINGS: Lower chest:  Heart size normal.  Visualized lung bases clear. Hepatobiliary: Normal unenhanced appearance of the liver. Gallbladder surgically absent. No biliary ductal dilation. Pancreas: Mildly atrophic without evidence of mass or peripancreatic inflammation. Spleen: Normal in size and appearance. Focus of accessory splenic tissue medial to the lower pole. Adrenals/Urinary Tract: Normal appearing adrenal glands. No evidence of urinary tract calculi or obstruction on either side. Within the limits of the unenhanced technique, no focal parenchymal abnormality involving either kidney. Normal-appearing decompressed urinary bladder. Stomach/Bowel: Stomach decompressed and unremarkable. Normal-appearing small bowel. Diverticulosis involving the sigmoid colon with edema/inflammation surrounding the proximal sigmoid colon. Solitary extraluminal gas bubble. No abnormal fluid collection. Lipoma involving the ileocecal valve. Surgically absent appendix. No ascites. Vascular/Lymphatic: Mild aortoiliac atherosclerosis without aneurysm. No pathologic lymphadenopathy. Reproductive: Surgically absent uterus. No adnexal masses or free pelvic fluid. Other: Phleboliths low in both sides of the pelvis. Musculoskeletal: Moderate degenerative changes in both hips. Chronic L5-S1 disc protrusion with calcification in the posterior annular fibers. Spondylosis involving the lower thoracic and upper lumbar spine. No acute abnormalities. IMPRESSION: 1. Acute diverticulitis involving the proximal sigmoid colon with evidence of contained microperforation. No evidence of abscess. 2. Mild pancreatic atrophy. Electronically Signed   By: Hulan Saas M.D.   On: 07/27/2015 21:20   I have personally reviewed and evaluated these images and lab results as part  of my medical decision-making.   EKG Interpretation None      MDM   Final diagnoses:  Diverticulitis of large intestine with perforation without bleeding    Patient given IV antibiotics. Discussed with general surgery. Will admit to medicine.   I personally performed the services described in this documentation, which was scribed in my presence. The recorded information has been reviewed and is accurate.     Rolland Porter, MD 08/18/15 [phone]

## 2015-07-28 LAB — CBC
HCT: 37.2 % (ref 36.0–46.0)
Hemoglobin: 12.1 g/dL (ref 12.0–15.0)
MCH: 30.9 pg (ref 26.0–34.0)
MCHC: 32.5 g/dL (ref 30.0–36.0)
MCV: 95.1 fL (ref 78.0–100.0)
PLATELETS: 209 10*3/uL (ref 150–400)
RBC: 3.91 MIL/uL (ref 3.87–5.11)
RDW: 13.3 % (ref 11.5–15.5)
WBC: 10.6 10*3/uL — AB (ref 4.0–10.5)

## 2015-07-28 LAB — MAGNESIUM: MAGNESIUM: 1.9 mg/dL (ref 1.7–2.4)

## 2015-07-28 LAB — GLUCOSE, CAPILLARY
GLUCOSE-CAPILLARY: 96 mg/dL (ref 65–99)
Glucose-Capillary: 108 mg/dL — ABNORMAL HIGH (ref 65–99)
Glucose-Capillary: 88 mg/dL (ref 65–99)
Glucose-Capillary: 92 mg/dL (ref 65–99)

## 2015-07-28 LAB — BASIC METABOLIC PANEL
ANION GAP: 7 (ref 5–15)
BUN: 14 mg/dL (ref 6–20)
CALCIUM: 8.5 mg/dL — AB (ref 8.9–10.3)
CO2: 28 mmol/L (ref 22–32)
Chloride: 107 mmol/L (ref 101–111)
Creatinine, Ser: 0.65 mg/dL (ref 0.44–1.00)
GLUCOSE: 110 mg/dL — AB (ref 65–99)
Potassium: 3.9 mmol/L (ref 3.5–5.1)
SODIUM: 142 mmol/L (ref 135–145)

## 2015-07-28 LAB — PHOSPHORUS: PHOSPHORUS: 3.9 mg/dL (ref 2.5–4.6)

## 2015-07-28 MED ORDER — SODIUM CHLORIDE 0.9 % IV SOLN
3.0000 g | Freq: Once | INTRAVENOUS | Status: AC
  Administered 2015-07-28: 3 g via INTRAVENOUS
  Filled 2015-07-28: qty 3

## 2015-07-28 MED ORDER — SODIUM CHLORIDE 0.9 % IV SOLN
INTRAVENOUS | Status: AC
  Filled 2015-07-28: qty 3

## 2015-07-28 MED ORDER — PIPERACILLIN-TAZOBACTAM 3.375 G IVPB: 3.3750 g | Freq: Three times a day (TID) | INTRAVENOUS | Status: DC

## 2015-07-28 MED ORDER — SODIUM CHLORIDE 0.9 % IV SOLN
3.0000 g | Freq: Four times a day (QID) | INTRAVENOUS | Status: DC
  Administered 2015-07-28 – 2015-07-30 (×8): 3 g via INTRAVENOUS
  Filled 2015-07-28 (×12): qty 3

## 2015-07-28 MED ORDER — PIPERACILLIN-TAZOBACTAM 3.375 G IVPB 30 MIN
3.3750 g | Freq: Three times a day (TID) | INTRAVENOUS | Status: DC
Start: 2015-07-28 — End: 2015-07-28
  Filled 2015-07-28: qty 50

## 2015-07-28 NOTE — Progress Notes (Addendum)
ANTIBIOTIC CONSULT NOTE  Pharmacy Consult for Unasyn Indication: Intra-abdominal infection  Allergies  Allergen Reactions  . Morphine And Related Nausea And Vomiting    Patient Measurements: Height:  (162.6 cm) Weight: 207 lb 12.8 oz (94.257 kg) IBW/kg (Calculated) : 54.7  Vital Signs: Temp: 98 F (36.7 C) (02/20 2349) Temp Source: Oral (02/20 2349) BP: 107/54 mmHg (02/20 2349) Pulse Rate: 78 (02/20 2349)  Labs:  Recent Labs  07/27/15 2110  WBC 16.2*  HGB 14.0  PLT 222  CREATININE 0.72    Estimated Creatinine Clearance: 83.2 mL/min (by C-G formula based on Cr of 0.72).  No results for input(s): VANCOTROUGH, VANCOPEAK, VANCORANDOM, GENTTROUGH, GENTPEAK, GENTRANDOM, TOBRATROUGH, TOBRAPEAK, TOBRARND, AMIKACINPEAK, AMIKACINTROU, AMIKACIN in the last 72 hours.   Microbiology: No results found for this or any previous visit (from the past 720 hour(s)).  Medical History: Past Medical History  Diagnosis Date  . Thyroid disease   . Diverticulitis   . Diabetes mellitus without complication (HCC)     Medications:  Ciprofloxacin 400 mg IV x 1 dose in the ED 07/27/15 Metronidazole 500 mg IV x 1 dose in the ED 07/27/15  Assessment: 60 yo obese female with history of diverticulitis seen in the ED with abdominal pain and N/V, and with elevated WBCs. CT showed diverticulitis with microperforation. Empiric antibiotics.  Goal of Therapy:  Eradicate infection  Plan:  Preliminary review of pertinent patient information completed.  Protocol will be initiated with a one-time dose of Unasyn 3 Gm IV.  Jeani Hawking clinical pharmacist will complete review during morning rounds to assess patient and finalize treatment regimen.  Arelia Sneddon, [facility] 07/28/2015,12:50 AM   Addum:  Michela Pitcher 3gm IV q6 hours F/u clinical course and cultures. Talbert Cage, PharmD

## 2015-07-28 NOTE — Care Management Note (Signed)
Case Management Note  Patient Details  Name: [name] MRN: [id number] Date of Birth: Feb 09, 1956  Subjective/Objective: Spoke with patient who is alert and oriented from home and independent.. Lives with husband and uses no DME. Drives self. No issues with medications.                   Action/Plan:  Home with self care. Expected Discharge Date:                  Expected Discharge Plan:  Home/Self Care  In-House Referral:     Discharge planning Services  CM Consult  Post Acute Care Choice:    Choice offered to:     DME Arranged:    DME Agency:     HH Arranged:    HH Agency:     Status of Service:  Completed, signed off  Medicare Important Message Given:    Date Medicare IM Given:    Medicare IM give by:    Date Additional Medicare IM Given:    Additional Medicare Important Message give by:     If discussed at Long Length of Stay Meetings, dates discussed:    Additional Comments:  Adonis Huguenin, RN 07/28/2015, 2:31 PM

## 2015-07-28 NOTE — Progress Notes (Signed)
[facility] PROGRESS NOTE  [name] WUJ:[id number] DOB: [birth year] DOA: 07/27/2015 PCP: Tracey Ribas, MD  Assessment/Plan: 1. Acute diverticulitis. -[name]. examination history of diverticulosis with prior episodes of diverticulitis. CT scan performed in the emergency department showed evidence of acute diverticulitis involving the proximal sigmoid colon associated with evidence of contained microperforation. -On exam she does not have peritoneal signs. -Case was discussed with Dr. Lovell Sheehan of general surgery who recommended medical management with IV antibiotic therapy. -Plan to continue IV antibiotic therapy with Unasyn 3 g every 6 hours. -Continue nothing by mouth status with IV fluid resuscitation.  2.  Type 2 diabetes mellitus. -She is currently nothing by mouth -Will provide sliding scale coverage every 4 hours.  3.  Hypothyroidism. -Continue Synthroid 125 g by mouth daily -Labs revealed TSH of 3.6  Code Status: Full code Family Communication: I spoke with her husband who is present at bedside Disposition Plan: Anticipate discharge home when medically stable   Consultants:  Curbside consult to general surgery  Antibiotics:  Unasyn started on 07/27/2015  HPI/Subjective: Suspect is a pleasant 60 year old female with a past medical history of diverticulosis, presented to the emergency department on 07/27/2015 with complaints of abdominal pain associate with nausea. A CT scan revealed the presence of acute diverticulitis involving the proximal sigmoid colon with evidence of contained microperforation. There is no evidence of abscess. She was started on empiric IV antibiotic therapy with Unasyn.  Objective: Filed Vitals:   07/27/15 2349 07/28/15 0622  BP: 107/54 97/60  Pulse: 78 75  Temp: 98 F (36.7 C) 98.1 F (36.7 C)  Resp: 18 20    Intake/Output Summary (Last 24 hours) at 07/28/15 1058 Last data filed at 07/28/15 0500  Gross per 24 hour  Intake       0 ml  Output    400 ml  Net   -400 ml   Filed Weights   07/27/15 2349  Weight: 94.257 kg (207 lb 12.8 oz)    Exam:   General:  Nontoxic appearing, awake and alert oriented  Cardiovascular: Regular rate rhythm normal S1-S2 no murmurs or gallops  Respiratory: Normal respiratory effort, lungs are clear to auscultation bilaterally  Abdomen: There is mild to moderate tenderness to palpation over left lower quadrant of abdomen, no peritoneal signs, guarding, rebound tenderness  Musculoskeletal: No edema  Data Reviewed: Basic Metabolic Panel:  Recent Labs Lab 07/27/15 2110 07/28/15 0604  NA 140 142  K 3.8 3.9  CL 105 107  CO2 25 28  GLUCOSE 136* 110*  BUN 12 14  CREATININE 0.72 0.65  CALCIUM 9.0 8.5*  MG 1.9  --   PHOS 3.9  --    Liver Function Tests:  Recent Labs Lab 07/27/15 2110  AST 17  ALT 14  ALKPHOS 39  BILITOT 1.1  PROT 7.2  ALBUMIN 4.0    Recent Labs Lab 07/27/15 2110  LIPASE 17   No results for input(s): AMMONIA in the last 168 hours. CBC:  Recent Labs Lab 07/27/15 2110 07/28/15 0604  WBC 16.2* 10.6*  NEUTROABS 13.3*  --   HGB 14.0 12.1  HCT 41.2 37.2  MCV 93.4 95.1  PLT 222 209   Cardiac Enzymes: No results for input(s): CKTOTAL, CKMB, CKMBINDEX, TROPONINI in the last 168 hours. BNP (last 3 results) No results for input(s): BNP in the last 8760 hours.  ProBNP (last 3 results) No results for input(s): PROBNP in the last 8760 hours.  CBG:  Recent Labs Lab 07/27/15 2354  07/28/15 0733  GLUCAP 151* 108*    No results found for this or any previous visit (from the past 240 hour(s)).   Studies: Ct Renal Stone Study  07/27/2015  CLINICAL DATA:  60 year old presenting with a acute onset of severe suprapubic spasms which began yesterday associated with nausea and dysuria. Surgical history includes cholecystectomy, appendectomy and hysterectomy. EXAM: CT ABDOMEN AND PELVIS WITHOUT CONTRAST TECHNIQUE: Multidetector CT imaging of  the abdomen and pelvis was performed following the standard protocol without IV contrast. COMPARISON:  05/02/2013, 11/14/2011, 8/1/1,010. FINDINGS: Lower chest:  Heart size normal.  Visualized lung bases clear. Hepatobiliary: Normal unenhanced appearance of the liver. Gallbladder surgically absent. No biliary ductal dilation. Pancreas: Mildly atrophic without evidence of mass or peripancreatic inflammation. Spleen: Normal in size and appearance. Focus of accessory splenic tissue medial to the lower pole. Adrenals/Urinary Tract: Normal appearing adrenal glands. No evidence of urinary tract calculi or obstruction on either side. Within the limits of the unenhanced technique, no focal parenchymal abnormality involving either kidney. Normal-appearing decompressed urinary bladder. Stomach/Bowel: Stomach decompressed and unremarkable. Normal-appearing small bowel. Diverticulosis involving the sigmoid colon with edema/inflammation surrounding the proximal sigmoid colon. Solitary extraluminal gas bubble. No abnormal fluid collection. Lipoma involving the ileocecal valve. Surgically absent appendix. No ascites. Vascular/Lymphatic: Mild aortoiliac atherosclerosis without aneurysm. No pathologic lymphadenopathy. Reproductive: Surgically absent uterus. No adnexal masses or free pelvic fluid. Other: Phleboliths low in both sides of the pelvis. Musculoskeletal: Moderate degenerative changes in both hips. Chronic L5-S1 disc protrusion with calcification in the posterior annular fibers. Spondylosis involving the lower thoracic and upper lumbar spine. No acute abnormalities. IMPRESSION: 1. Acute diverticulitis involving the proximal sigmoid colon with evidence of contained microperforation. No evidence of abscess. 2. Mild pancreatic atrophy. Electronically Signed   By: Hulan Saas M.D.   On: 07/27/2015 21:20    Scheduled Meds: . aspirin EC  81 mg Oral Daily  . docusate sodium  100 mg Oral BID  . heparin  5,000 Units  Subcutaneous 3 times per day  . insulin aspart  0-9 Units Subcutaneous TID WC  . levothyroxine  125 mcg Oral QAC breakfast   Continuous Infusions: . sodium chloride 100 mL/hr at 07/28/15 0000    Principal Problem:   Diverticulitis of colon with perforation Active Problems:   Diverticulitis   Obesity   Hypothyroidism   Diabetes mellitus type 2, diet-controlled (HCC)   Leukocytosis   Constipation   Nausea   Abdominal pain    Time spent: 25 minutes    Jeralyn Bennett  [facility] Pager [phone]. If 7PM-7AM, please contact night-coverage at [email or web address], password [facility] 07/28/2015, 10:58 AM  LOS: 1 day

## 2015-07-29 DIAGNOSIS — D72829 Elevated white blood cell count, unspecified: Secondary | ICD-10-CM

## 2015-07-29 DIAGNOSIS — E038 Other specified hypothyroidism: Secondary | ICD-10-CM

## 2015-07-29 DIAGNOSIS — E119 Type 2 diabetes mellitus without complications: Secondary | ICD-10-CM

## 2015-07-29 DIAGNOSIS — K5720 Diverticulitis of large intestine with perforation and abscess without bleeding: Principal | ICD-10-CM

## 2015-07-29 LAB — CBC
HCT: 36.2 % (ref 36.0–46.0)
HEMOGLOBIN: 11.9 g/dL — AB (ref 12.0–15.0)
MCH: 31.2 pg (ref 26.0–34.0)
MCHC: 32.9 g/dL (ref 30.0–36.0)
MCV: 95 fL (ref 78.0–100.0)
PLATELETS: 175 10*3/uL (ref 150–400)
RBC: 3.81 MIL/uL — AB (ref 3.87–5.11)
RDW: 13.2 % (ref 11.5–15.5)
WBC: 6 10*3/uL (ref 4.0–10.5)

## 2015-07-29 LAB — BASIC METABOLIC PANEL
ANION GAP: 8 (ref 5–15)
BUN: 10 mg/dL (ref 6–20)
CHLORIDE: 108 mmol/L (ref 101–111)
CO2: 27 mmol/L (ref 22–32)
Calcium: 8.4 mg/dL — ABNORMAL LOW (ref 8.9–10.3)
Creatinine, Ser: 0.72 mg/dL (ref 0.44–1.00)
GFR calc non Af Amer: >60 mL/min (ref >60)
Glucose, Bld: 112 mg/dL — ABNORMAL HIGH (ref 65–99)
Potassium: 4.2 mmol/L (ref 3.5–5.1)
SODIUM: 143 mmol/L (ref 135–145)

## 2015-07-29 LAB — GLUCOSE, CAPILLARY
GLUCOSE-CAPILLARY: 105 mg/dL — AB (ref 65–99)
GLUCOSE-CAPILLARY: 91 mg/dL (ref 65–99)
GLUCOSE-CAPILLARY: 80 mg/dL (ref 65–99)
GLUCOSE-CAPILLARY: 117 mg/dL — AB (ref 65–99)

## 2015-07-29 LAB — HEMOGLOBIN A1C
Hgb A1c MFr Bld: 6.2 % — ABNORMAL HIGH (ref 4.8–5.6)
Mean Plasma Glucose: 131 mg/dL

## 2015-07-29 NOTE — Progress Notes (Signed)
[facility] PROGRESS NOTE  RILLIE RIFFEL LKG:[id number] DOB: 1955/07/23 DOA: 07/27/2015 PCP: Colette Ribas, MD  Assessment/Plan: 1. Acute diverticulitis involving the proximal sigmoid colon associated with contained microperforation. No peritoneal signs. WBC is now wnl. Admitting physician discussed with Dr. Lovell Sheehan, general surgery, who recommended medical management. Continue IV abx,  IVF, will advance to full liquids. 2. DM type 2, stable. Hgb A1c 6.2. Blood sugars are stable 3. Hypothyroidism, continue synthroid.   Code Status: Full DVT prophylaxis: Heparin Family Communication: Discussed with husband  Disposition Plan: Anticipate discharge once improved, possibly in a.m.   Consultants:    Procedures:  none  Antibiotics:  Unasyn 2/20>>  HPI/Subjective: Feeling better. Abdominal pain improving. No vomiting. Tolerating clear liquids.  Objective: Filed Vitals:   07/29/15 0607 07/29/15 1424  BP: 122/71 115/78  Pulse: 72 64  Temp: 98.1 F (36.7 C) 98.4 F (36.9 C)  Resp: 18 18    Intake/Output Summary (Last 24 hours) at 07/29/15 1505 Last data filed at 07/29/15 0800  Gross per 24 hour  Intake   2980 ml  Output    800 ml  Net   2180 ml   Filed Weights   07/27/15 2349  Weight: 94.257 kg (207 lb 12.8 oz)    Exam:  General: NAD, looks comfortable Cardiovascular: RRR, S1, S2  Respiratory: clear bilaterally, No wheezing, rales or rhonchi Abdomen: soft, diffuse tenderness in abdomen, more so on the left side, no distention , bowel sounds normal Musculoskeletal: No edema b/l   Data Reviewed: Basic Metabolic Panel:  Recent Labs Lab 07/27/15 2110 07/28/15 0604 07/29/15 0643  NA 140 142 143  K 3.8 3.9 4.2  CL 105 107 108  CO2 GLUCOSE 136* 110* 112*  BUN CREATININE 0.72 0.65 0.72  CALCIUM 9.0 8.5* 8.4*  MG 1.9  --   --   PHOS 3.9  --   --    Liver Function Tests:  Recent Labs Lab 07/27/15 2110  AST 17  ALT 14   ALKPHOS 39  BILITOT 1.1  PROT 7.2  ALBUMIN 4.0    Recent Labs Lab 07/27/15 2110  LIPASE 17    CBC:  Recent Labs Lab 07/27/15 2110 07/28/15 0604 07/29/15 0643  WBC 16.2* 10.6* 6.0  NEUTROABS 13.3*  --   --   HGB 14.0 12.1 11.9*  HCT 41.2 37.2 36.2  MCV 93.4 95.1 95.0  PLT 222 209 175     CBG:  Recent Labs Lab 07/28/15 1146 07/28/15 1639 07/28/15 2113 07/29/15 0721 07/29/15 1135  GLUCAP 88 96 92 105* 91   Studies: Ct Renal Stone Study  07/27/2015  CLINICAL DATA:  60 year old presenting with a acute onset of severe suprapubic spasms which began yesterday associated with nausea and dysuria. Surgical history includes cholecystectomy, appendectomy and hysterectomy. EXAM: CT ABDOMEN AND PELVIS WITHOUT CONTRAST TECHNIQUE: Multidetector CT imaging of the abdomen and pelvis was performed following the standard protocol without IV contrast. COMPARISON:  05/02/2013, 11/14/2011, 8/1/1,010. FINDINGS: Lower chest:  Heart size normal.  Visualized lung bases clear. Hepatobiliary: Normal unenhanced appearance of the liver. Gallbladder surgically absent. No biliary ductal dilation. Pancreas: Mildly atrophic without evidence of mass or peripancreatic inflammation. Spleen: Normal in size and appearance. Focus of accessory splenic tissue medial to the lower pole. Adrenals/Urinary Tract: Normal appearing adrenal glands. No evidence of urinary tract calculi or obstruction on either side. Within the limits of the unenhanced technique, no focal parenchymal abnormality involving either kidney.  Normal-appearing decompressed urinary bladder. Stomach/Bowel: Stomach decompressed and unremarkable. Normal-appearing small bowel. Diverticulosis involving the sigmoid colon with edema/inflammation surrounding the proximal sigmoid colon. Solitary extraluminal gas bubble. No abnormal fluid collection. Lipoma involving the ileocecal valve. Surgically absent appendix. No ascites. Vascular/Lymphatic: Mild aortoiliac  atherosclerosis without aneurysm. No pathologic lymphadenopathy. Reproductive: Surgically absent uterus. No adnexal masses or free pelvic fluid. Other: Phleboliths low in both sides of the pelvis. Musculoskeletal: Moderate degenerative changes in both hips. Chronic L5-S1 disc protrusion with calcification in the posterior annular fibers. Spondylosis involving the lower thoracic and upper lumbar spine. No acute abnormalities. IMPRESSION: 1. Acute diverticulitis involving the proximal sigmoid colon with evidence of contained microperforation. No evidence of abscess. 2. Mild pancreatic atrophy. Electronically Signed   By: Hulan Saas M.D.   On: 07/27/2015 21:20    Scheduled Meds: . ampicillin-sulbactam (UNASYN) IV  3 g Intravenous Q6H  . aspirin EC  81 mg Oral Daily  . docusate sodium  100 mg Oral BID  . heparin  5,000 Units Subcutaneous 3 times per day  . insulin aspart  0-9 Units Subcutaneous TID WC  . levothyroxine  125 mcg Oral QAC breakfast   Continuous Infusions: . sodium chloride 100 mL/hr at 07/29/15 1610    Principal Problem:   Diverticulitis of colon with perforation Active Problems:   Diverticulitis   Obesity   Hypothyroidism   Diabetes mellitus type 2, diet-controlled (HCC)   Leukocytosis   Constipation   Nausea   Abdominal pain    Time spent: 25 minutes    Nyal Schachter. MD  [facility] Pager [phone]. If 7pm-7AM, please contact night-coverage at [email or web address], password [facility] 07/29/2015, 3:05 PM  LOS: 2 days

## 2015-07-30 LAB — GLUCOSE, CAPILLARY
Glucose-Capillary: 99 mg/dL (ref 65–99)
Glucose-Capillary: 104 mg/dL — ABNORMAL HIGH (ref 65–99)

## 2015-07-30 MED ORDER — OXYCODONE HCL 5 MG PO TABS: 5.0000 mg | ORAL_TABLET | Freq: Four times a day (QID) | ORAL | Status: DC | PRN

## 2015-07-30 MED ORDER — AMOXICILLIN-POT CLAVULANATE 875-125 MG PO TABS: 1.0000 | ORAL_TABLET | Freq: Two times a day (BID) | ORAL | Status: DC

## 2015-07-30 NOTE — Plan of Care (Signed)
Problem: Pain Managment: Goal: General experience of comfort will improve Outcome: Completed/Met Date Met:  07/30/15 Pt refuses pain medication.  Problem: Physical Regulation: Goal: Ability to maintain clinical measurements within normal limits will improve Outcome: Progressing See flowsheet.

## 2015-07-30 NOTE — Discharge Summary (Signed)
Physician Discharge Summary  [name] ZOX:[id number] DOB: [birth year] DOA: 07/27/2015  PCP: Colette Ribas, MD  Admit date: 07/27/2015 Discharge date: 07/30/2015  Time spent: 35 minutes  Recommendations for Outpatient Follow-up:  1. Follow up with PCP within 1-2 weeks.  2. Follow-up with GI, as scheduled.   Discharge Diagnoses:  Principal Problem:   Diverticulitis of colon with perforation Active Problems:   Diverticulitis   Obesity   Hypothyroidism   Diabetes mellitus type 2, diet-controlled (HCC)   Leukocytosis   Constipation   Nausea   Abdominal pain   Discharge Condition: Improved   Diet recommendation: Low-fiber.   Filed Weights   07/27/15 2349  Weight: 94.257 kg (207 lb 12.8 oz)    History of present illness:  60 year old female with PMH of thyroid disease, diet-controlled DM type 2, and diverticulitis presented with complaints of abdominal pain with associated nausea, vomiting, and loss of appetite for two days prior to admission. She also increased urinary frequency, constipation, and pain in left flank and suprapubic area. In the ED was found to have a low grade fever of 99., WBC 16.2 and CT A/P revealed diverticulitis with microperforation. She was admitted for further evaluation and treatment.   Hospital Course:  Patient was admitted for treatment of acute diverticulitis revealed on CT A/P. Admitting physician discussed with Dr. Lovell Sheehan, general surgery, who recommended medical management. Patient provided IV abx, IVF, and bowel rest on admission with improvement in symptoms. On discharge she was transitioned to oral abx and had no further recurrence of pain, nausea, or vomiting. She has been advised to follow-up with her primary GI. 1. DM type 2, stable. Hgb A1c 6.2. Blood sugars remained stable. 2. Hypothyroidism, continue synthroid.  Procedures:  None  Consultations:  None  Discharge Exam: Filed Vitals:   07/29/15 2218 07/30/15 0535  BP:  101/61 110/71  Pulse: 62 66  Temp: 98.5 F (36.9 C) 98.7 F (37.1 C)  Resp: 18 18    General: NAD, looks comfortable  Cardiovascular: RRR, S1, S2   Respiratory: clear bilaterally, No wheezing, rales or rhonchi  Abdomen: soft, non tender, no distention , bowel sounds normal  Musculoskeletal: No edema b/l  Discharge Instructions   Discharge Instructions    Diet - low sodium heart healthy    Complete by:  As directed      Increase activity slowly    Complete by:  As directed           Current Discharge Medication List    START taking these medications   Details  amoxicillin-clavulanate (AUGMENTIN) 875-125 MG tablet Take 1 tablet by mouth 2 (two) times daily. Qty: 20 tablet, Refills: 0    oxyCODONE (OXY IR/ROXICODONE) 5 MG immediate release tablet Take 1 tablet (5 mg total) by mouth every 6 (six) hours as needed for moderate pain. Qty: 30 tablet, Refills: 0      CONTINUE these medications which have NOT CHANGED   Details  aspirin EC 81 MG tablet Take 81 mg by mouth daily.    docusate sodium (COLACE) 100 MG capsule Take 100 mg by mouth 2 (two) times daily.    levothyroxine (SYNTHROID, LEVOTHROID) 125 MCG tablet Take 125 mcg by mouth daily before breakfast.    Lutein 20 MG TABS Take 20 mg by mouth daily.       Allergies  Allergen Reactions  . Morphine And Related Nausea And Vomiting      The results of significant diagnostics from this hospitalization (  including imaging, microbiology, ancillary and laboratory) are listed below for reference.    Significant Diagnostic Studies: Ct Renal Stone Study  07/27/2015  CLINICAL DATA:  60 year old presenting with a acute onset of severe suprapubic spasms which began yesterday associated with nausea and dysuria. Surgical history includes cholecystectomy, appendectomy and hysterectomy. EXAM: CT ABDOMEN AND PELVIS WITHOUT CONTRAST TECHNIQUE: Multidetector CT imaging of the abdomen and pelvis was performed following the  standard protocol without IV contrast. COMPARISON:  05/02/2013, 11/14/2011, 8/1/1,010. FINDINGS: Lower chest:  Heart size normal.  Visualized lung bases clear. Hepatobiliary: Normal unenhanced appearance of the liver. Gallbladder surgically absent. No biliary ductal dilation. Pancreas: Mildly atrophic without evidence of mass or peripancreatic inflammation. Spleen: Normal in size and appearance. Focus of accessory splenic tissue medial to the lower pole. Adrenals/Urinary Tract: Normal appearing adrenal glands. No evidence of urinary tract calculi or obstruction on either side. Within the limits of the unenhanced technique, no focal parenchymal abnormality involving either kidney. Normal-appearing decompressed urinary bladder. Stomach/Bowel: Stomach decompressed and unremarkable. Normal-appearing small bowel. Diverticulosis involving the sigmoid colon with edema/inflammation surrounding the proximal sigmoid colon. Solitary extraluminal gas bubble. No abnormal fluid collection. Lipoma involving the ileocecal valve. Surgically absent appendix. No ascites. Vascular/Lymphatic: Mild aortoiliac atherosclerosis without aneurysm. No pathologic lymphadenopathy. Reproductive: Surgically absent uterus. No adnexal masses or free pelvic fluid. Other: Phleboliths low in both sides of the pelvis. Musculoskeletal: Moderate degenerative changes in both hips. Chronic L5-S1 disc protrusion with calcification in the posterior annular fibers. Spondylosis involving the lower thoracic and upper lumbar spine. No acute abnormalities. IMPRESSION: 1. Acute diverticulitis involving the proximal sigmoid colon with evidence of contained microperforation. No evidence of abscess. 2. Mild pancreatic atrophy. Electronically Signed   By: Hulan Saas M.D.   On: 07/27/2015 21:20    Microbiology: No results found for this or any previous visit (from the past 240 hour(s)).   Labs: Basic Metabolic Panel:  Recent Labs Lab 07/27/15 2110  07/28/15 0604 07/29/15 0643  NA 140 142 143  K 3.8 3.9 4.2  CL 105 107 108  CO2 25 28 27   GLUCOSE 136* 110* 112*  BUN 12 14 10   CREATININE 0.72 0.65 0.72  CALCIUM 9.0 8.5* 8.4*  MG 1.9  --   --   PHOS 3.9  --   --    Liver Function Tests:  Recent Labs Lab 07/27/15 2110  AST 17  ALT 14  ALKPHOS 39  BILITOT 1.1  PROT 7.2  ALBUMIN 4.0    Recent Labs Lab 07/27/15 2110  LIPASE 17  CBC:  Recent Labs Lab 07/27/15 2110 07/28/15 0604 07/29/15 0643  WBC 16.2* 10.6* 6.0  NEUTROABS 13.3*  --   --   HGB 14.0 12.1 11.9*  HCT 41.2 37.2 36.2  MCV 93.4 95.1 95.0  PLT 222 209 175    CBG:  Recent Labs Lab 07/29/15 0721 07/29/15 1135 07/29/15 1732 07/29/15 2012 07/30/15 0739  GLUCAP 105* 91 80 117* 99       Signed:  Erick Blinks, MD  [facility] 07/30/2015, 11:15 AM    By signing my name below, I, Zadie Cleverly, attest that this documentation has been prepared under the direction and in the presence of Erick Blinks, MD. Electronically signed: Zadie Cleverly, Scribe. 07/30/2015 10:56am  I, Dr. Erick Blinks, personally performed the services described in this documentaiton. All medical record entries made by the scribe were at my direction and in my presence. I have reviewed the chart and agree that the record reflects  my personal performance and is accurate and complete  Erick Blinks, MD, 07/30/2015 11:15 AM

## 2015-07-30 NOTE — Progress Notes (Signed)
Patient with orders to be discharge home. Discharge instructions given, patient verbalized understanding. Prescriptions given. Patient stable. Patient left in private vehicle with spouse.  

## 2015-08-13 ENCOUNTER — Ambulatory Visit (INDEPENDENT_AMBULATORY_CARE_PROVIDER_SITE_OTHER): Payer: Managed Care, Other (non HMO) | Admitting: Internal Medicine

## 2015-08-13 ENCOUNTER — Encounter (INDEPENDENT_AMBULATORY_CARE_PROVIDER_SITE_OTHER): Payer: Self-pay | Admitting: Internal Medicine

## 2015-08-13 VITALS — BP 160/90 | HR 80 | Temp 98.6°F | Ht 64.0 in | Wt 211.8 lb

## 2015-08-13 DIAGNOSIS — K5732 Diverticulitis of large intestine without perforation or abscess without bleeding: Secondary | ICD-10-CM | POA: Diagnosis not present

## 2015-08-13 MED ORDER — AMOXICILLIN-POT CLAVULANATE 875-125 MG PO TABS: 1.0000 | ORAL_TABLET | Freq: Two times a day (BID) | ORAL | Status: DC

## 2015-08-13 NOTE — Patient Instructions (Signed)
CBC  Today Repeat scan in 3 weeks.

## 2015-08-13 NOTE — Progress Notes (Signed)
Subjective:    Patient ID: [name], female    DOB: 02/05/1956, 60 y.o.   MRN: [id number]  HPI Admitted to [facility] in February x 4 days with diverticulitis. She had been having suprapubic pain for about a month. Presented to the ED and underwent a CT scan which revealed acute diverticulitis involving the proximal sigmoid colon with evidende of contained microperforation. No evidence of abscess.  Dr. Franky MachoMark Jenkins consulted and he recommended medical management.  After discharge she was covered with Augmentin x 10 days. She tells me she has a small amt of nagging pain in her suprapubic. She is having a BM daily.  She is on a regular diet. Her appetite is okay. There has been no fever.    07/27/2015: CT renal study:  Lower abdominal pain.  IMPRESSION: 1. Acute diverticulitis involving the proximal sigmoid colon with evidence of contained microperforation. No evidence of abscess. 2. Mild pancreatic atrophy.   11/27/2014 Colonoscopy  Indications: Patient is 60 year old Caucasian female who presents with change in her bowel habits. She is to have 3-4 bowel movements per day and now she would go a few days without a bowel movement. Last colonoscopy was 2 years ago with removal of single polyp which is hypoplastic. Family is very significant for CRC. Father was diagnosed with colon carcinoma in his 5340s and is diseased and he had 6 siblings and they all had colon cancer. Five are still living.  Impression:  Examination performed to cecum. Multiple diverticula at sigmoid colon along with few more at descending colon. No evidence of colonic polyps or neoplasm.  Review of Systems Past Medical History  Diagnosis Date  . Thyroid disease   . Diverticulitis   . Diabetes mellitus without complication [facility](HCC)     Past Surgical History  Procedure Laterality Date  . Cholecystectomy    . Appendectomy    . Abdominal hysterectomy    . Colonoscopy w/ polypectomy      2014 Dr. Fonda KinderShefflett  .  Colonoscopy N/A 11/27/2014    Procedure: COLONOSCOPY;  Surgeon: Malissa HippoNajeeb U Rehman, MD;  Location: [facility] ENDO SUITE;  Service: Endoscopy;  Laterality: N/A;  220 - moved to 1:55 - Ann to notify pt    Allergies  Allergen Reactions  . Morphine And Related Nausea And Vomiting    Current Outpatient Prescriptions on File Prior to Visit  Medication Sig Dispense Refill  . aspirin EC 81 MG tablet Take 81 mg by mouth daily.    Marland Kitchen. docusate sodium (COLACE) 100 MG capsule Take 100 mg by mouth 2 (two) times daily.    Marland Kitchen. levothyroxine (SYNTHROID, LEVOTHROID) 125 MCG tablet Take 125 mcg by mouth daily before breakfast.    . Lutein 20 MG TABS Take 20 mg by mouth daily.    Marland Kitchen. amoxicillin-clavulanate (AUGMENTIN) 875-125 MG tablet Take 1 tablet by mouth 2 (two) times daily. (Patient not taking: Reported on 08/13/2015) 20 tablet 0   No current facility-administered medications on file prior to visit.        Objective:   Physical Exam Blood pressure 160/90, pulse 80, temperature 98.6 F (37 C), height 5\' 4"  (1.626 m), weight 211 lb 12.8 oz (96.072 kg). Alert and oriented. Skin warm and dry. Oral mucosa is moist.   . Sclera anicteric, conjunctivae is pink. Thyroid not enlarged. No cervical lymphadenopathy. Lungs clear. Heart regular rate and rhythm.  Abdomen is soft. Bowel sounds are positive. No hepatomegaly. No abdominal masses felt. No tenderness.  No edema to lower  extremities.          Assessment & Plan:  Diverticulitis. Will call Rx in for Augmentin x 10 more days. She will call next week and let me know how she is doing. Will repeat CT  In 3 weeks.

## 2015-08-14 LAB — CBC WITH DIFFERENTIAL/PLATELET
BASOS PCT: 0 % (ref 0–1)
Basophils Absolute: 0 10*3/uL (ref 0.0–0.1)
EOS ABS: 0.2 10*3/uL (ref 0.0–0.7)
EOS PCT: 2 % (ref 0–5)
HCT: 40.2 % (ref 36.0–46.0)
Hemoglobin: 13.4 g/dL (ref 12.0–15.0)
Lymphocytes Relative: 29 % (ref 12–46)
Lymphs Abs: 2.6 10*3/uL (ref 0.7–4.0)
MCH: 30.5 pg (ref 26.0–34.0)
MCHC: 33.3 g/dL (ref 30.0–36.0)
MCV: 91.6 fL (ref 78.0–100.0)
MONOS PCT: 6 % (ref 3–12)
MPV: 11.1 fL (ref 8.6–12.4)
Monocytes Absolute: 0.5 10*3/uL (ref 0.1–1.0)
NEUTROS PCT: 63 % (ref 43–77)
Neutro Abs: 5.6 10*3/uL (ref 1.7–7.7)
PLATELETS: 243 10*3/uL (ref 150–400)
RBC: 4.39 MIL/uL (ref 3.87–5.11)
RDW: 13.7 % (ref 11.5–15.5)
WBC: 8.9 10*3/uL (ref 4.0–10.5)

## 2015-09-04 ENCOUNTER — Ambulatory Visit (HOSPITAL_COMMUNITY)
Admission: RE | Admit: 2015-09-04 | Discharge: 2015-09-04 | Disposition: A | Discharge status: Home / Self Care | Payer: Managed Care, Other (non HMO) | Source: Ambulatory Visit | Attending: Internal Medicine | Admitting: Internal Medicine

## 2015-09-04 DIAGNOSIS — K5730 Diverticulosis of large intestine without perforation or abscess without bleeding: Secondary | ICD-10-CM | POA: Insufficient documentation

## 2015-09-04 DIAGNOSIS — K5732 Diverticulitis of large intestine without perforation or abscess without bleeding: Secondary | ICD-10-CM | POA: Diagnosis present

## 2015-09-04 MED ORDER — IOHEXOL 300 MG/ML  SOLN
100.0000 mL | Freq: Once | INTRAMUSCULAR | Status: AC | PRN
  Administered 2015-09-04: 100 mL via INTRAVENOUS

## 2015-11-03 ENCOUNTER — Other Ambulatory Visit (HOSPITAL_COMMUNITY): Payer: Self-pay | Admitting: Family Medicine

## 2015-11-03 DIAGNOSIS — Z1231 Encounter for screening mammogram for malignant neoplasm of breast: Secondary | ICD-10-CM

## 2015-12-02 ENCOUNTER — Ambulatory Visit (HOSPITAL_COMMUNITY): Payer: Managed Care, Other (non HMO)

## 2015-12-03 ENCOUNTER — Ambulatory Visit (HOSPITAL_COMMUNITY): Payer: Managed Care, Other (non HMO)

## 2015-12-24 ENCOUNTER — Ambulatory Visit (HOSPITAL_COMMUNITY)
Admission: RE | Admit: 2015-12-24 | Discharge: 2015-12-24 | Disposition: A | Discharge status: Home / Self Care | Payer: No Typology Code available for payment source | Source: Ambulatory Visit | Attending: Family Medicine | Admitting: Family Medicine

## 2015-12-24 DIAGNOSIS — Z1231 Encounter for screening mammogram for malignant neoplasm of breast: Secondary | ICD-10-CM | POA: Diagnosis not present

## 2016-02-01 ENCOUNTER — Encounter (INDEPENDENT_AMBULATORY_CARE_PROVIDER_SITE_OTHER): Payer: Self-pay | Admitting: Internal Medicine

## 2016-02-24 ENCOUNTER — Ambulatory Visit (INDEPENDENT_AMBULATORY_CARE_PROVIDER_SITE_OTHER): Payer: PRIVATE HEALTH INSURANCE | Admitting: Internal Medicine

## 2016-07-19 ENCOUNTER — Telehealth (INDEPENDENT_AMBULATORY_CARE_PROVIDER_SITE_OTHER): Payer: Self-pay | Admitting: Internal Medicine

## 2016-07-19 DIAGNOSIS — K5732 Diverticulitis of large intestine without perforation or abscess without bleeding: Secondary | ICD-10-CM

## 2016-07-19 NOTE — Telephone Encounter (Signed)
Having low abdominal pain like it usually is with diverticulitis. Pain started a few days ago

## 2016-07-19 NOTE — Telephone Encounter (Signed)
CT sch'd 07/27/16 at 400 (345), pick up contrast, patient aware

## 2016-07-19 NOTE — Telephone Encounter (Signed)
CT sch'd 07/27/16, patient aware

## 2016-07-19 NOTE — Telephone Encounter (Signed)
Ann, CT abdomen/pelvis with CM 

## 2016-07-19 NOTE — Telephone Encounter (Signed)
Ann, I have ordered a CT abdomen/pelvis with CM

## 2016-07-19 NOTE — Telephone Encounter (Signed)
Patient called, stated that she thinks she is having a flare up of diverticulitis again. She stated that she started taking an antibiotic yesterday that she had on hand.  She is concerned because the last time she had a small tare in her colon.  She thinks she may need a CT.  Please advise.  [phone]

## 2016-07-20 ENCOUNTER — Emergency Department (HOSPITAL_COMMUNITY)
Admission: EM | Admit: 2016-07-20 | Discharge: 2016-07-20 | Disposition: A | Discharge status: Home / Self Care | Payer: No Typology Code available for payment source | Attending: Emergency Medicine | Admitting: Emergency Medicine

## 2016-07-20 ENCOUNTER — Emergency Department (HOSPITAL_COMMUNITY): Payer: No Typology Code available for payment source

## 2016-07-20 ENCOUNTER — Encounter (HOSPITAL_COMMUNITY): Payer: Self-pay | Admitting: Emergency Medicine

## 2016-07-20 DIAGNOSIS — E039 Hypothyroidism, unspecified: Secondary | ICD-10-CM | POA: Insufficient documentation

## 2016-07-20 DIAGNOSIS — Z7982 Long term (current) use of aspirin: Secondary | ICD-10-CM | POA: Insufficient documentation

## 2016-07-20 DIAGNOSIS — Z7984 Long term (current) use of oral hypoglycemic drugs: Secondary | ICD-10-CM | POA: Diagnosis not present

## 2016-07-20 DIAGNOSIS — Z87891 Personal history of nicotine dependence: Secondary | ICD-10-CM | POA: Insufficient documentation

## 2016-07-20 DIAGNOSIS — E119 Type 2 diabetes mellitus without complications: Secondary | ICD-10-CM | POA: Diagnosis not present

## 2016-07-20 DIAGNOSIS — R1030 Lower abdominal pain, unspecified: Secondary | ICD-10-CM

## 2016-07-20 DIAGNOSIS — R1032 Left lower quadrant pain: Secondary | ICD-10-CM | POA: Diagnosis not present

## 2016-07-20 DIAGNOSIS — Z79899 Other long term (current) drug therapy: Secondary | ICD-10-CM | POA: Insufficient documentation

## 2016-07-20 DIAGNOSIS — R1031 Right lower quadrant pain: Secondary | ICD-10-CM | POA: Insufficient documentation

## 2016-07-20 LAB — URINALYSIS, ROUTINE W REFLEX MICROSCOPIC
BILIRUBIN URINE: NEGATIVE
GLUCOSE, UA: NEGATIVE mg/dL
HGB URINE DIPSTICK: NEGATIVE
Ketones, ur: 20 mg/dL — AB
NITRITE: NEGATIVE
PH: 5 (ref 5.0–8.0)
Protein, ur: NEGATIVE mg/dL
SPECIFIC GRAVITY, URINE: 1.023 (ref 1.005–1.030)

## 2016-07-20 LAB — COMPREHENSIVE METABOLIC PANEL
ALK PHOS: 35 U/L — AB (ref 38–126)
ALT: 20 U/L (ref 14–54)
AST: 18 U/L (ref 15–41)
Albumin: 4.1 g/dL (ref 3.5–5.0)
Anion gap: 14 (ref 5–15)
BUN: 19 mg/dL (ref 6–20)
CALCIUM: 9.4 mg/dL (ref 8.9–10.3)
CHLORIDE: 100 mmol/L — AB (ref 101–111)
CO2: 25 mmol/L (ref 22–32)
CREATININE: 0.82 mg/dL (ref 0.44–1.00)
GFR calc non Af Amer: >60 mL/min (ref >60)
Glucose, Bld: 152 mg/dL — ABNORMAL HIGH (ref 65–99)
Potassium: 3.8 mmol/L (ref 3.5–5.1)
SODIUM: 139 mmol/L (ref 135–145)
Total Bilirubin: 1.1 mg/dL (ref 0.3–1.2)
Total Protein: 7.6 g/dL (ref 6.5–8.1)

## 2016-07-20 LAB — CBC WITH DIFFERENTIAL/PLATELET
BASOS PCT: 0 %
Basophils Absolute: 0 10*3/uL (ref 0.0–0.1)
EOS ABS: 0.2 10*3/uL (ref 0.0–0.7)
EOS PCT: 2 %
HCT: 41.4 % (ref 36.0–46.0)
HEMOGLOBIN: 14.1 g/dL (ref 12.0–15.0)
LYMPHS ABS: 2.1 10*3/uL (ref 0.7–4.0)
Lymphocytes Relative: 18 %
MCH: 31.8 pg (ref 26.0–34.0)
MCHC: 34.1 g/dL (ref 30.0–36.0)
MCV: 93.5 fL (ref 78.0–100.0)
MONO ABS: 0.8 10*3/uL (ref 0.1–1.0)
MONOS PCT: 7 %
NEUTROS PCT: 73 %
Neutro Abs: 8.6 10*3/uL — ABNORMAL HIGH (ref 1.7–7.7)
PLATELETS: 231 10*3/uL (ref 150–400)
RBC: 4.43 MIL/uL (ref 3.87–5.11)
RDW: 13.2 % (ref 11.5–15.5)
WBC: 11.7 10*3/uL — ABNORMAL HIGH (ref 4.0–10.5)

## 2016-07-20 MED ORDER — METRONIDAZOLE 500 MG PO TABS: 500.0000 mg | ORAL_TABLET | Freq: Three times a day (TID) | ORAL | 0 refills | Status: DC

## 2016-07-20 MED ORDER — SODIUM CHLORIDE 0.9 % IV BOLUS (SEPSIS)
500.0000 mL | Freq: Once | INTRAVENOUS | Status: AC
  Administered 2016-07-20: 500 mL via INTRAVENOUS

## 2016-07-20 MED ORDER — IOPAMIDOL (ISOVUE-300) INJECTION 61%
100.0000 mL | Freq: Once | INTRAVENOUS | Status: AC | PRN
  Administered 2016-07-20: 100 mL via INTRAVENOUS

## 2016-07-20 MED ORDER — FENTANYL CITRATE (PF) 100 MCG/2ML IJ SOLN
50.0000 ug | Freq: Once | INTRAMUSCULAR | Status: AC
  Administered 2016-07-20: 50 ug via INTRAVENOUS
  Filled 2016-07-20: qty 2

## 2016-07-20 MED ORDER — SODIUM CHLORIDE 0.9 % IV BOLUS (SEPSIS)
1000.0000 mL | Freq: Once | INTRAVENOUS | Status: AC
  Administered 2016-07-20: 1000 mL via INTRAVENOUS

## 2016-07-20 MED ORDER — CIPROFLOXACIN HCL 500 MG PO TABS: 500.0000 mg | ORAL_TABLET | Freq: Two times a day (BID) | ORAL | 0 refills | Status: DC

## 2016-07-20 MED ORDER — ONDANSETRON HCL 4 MG/2ML IJ SOLN
4.0000 mg | Freq: Once | INTRAMUSCULAR | Status: AC
  Administered 2016-07-20: 4 mg via INTRAVENOUS
  Filled 2016-07-20: qty 2

## 2016-07-20 NOTE — ED Notes (Signed)
Sharmon Leydenony Beal (husband) [phone]

## 2016-07-20 NOTE — ED Provider Notes (Signed)
[facility] Provider Note   CSN: [id number] Arrival date & time: 07/20/16  0226  Time seen 03:00 AM   History   Chief Complaint Chief Complaint  Patient presents with  . Abdominal Pain    HPI [name] is a 61 y.o. female.  HPI  patient states she started getting diffuse bilateral lower abdominal pain on February 12. The pain has been there constantly and intermittently gets very extreme spasms. She's had nausea without vomiting or diarrhea. She states she's had 2 bowel movements, the first was hard and the second was soft. She states she had undocumented fever the first day. She has dysuria with pressure in her abdomen and frequency. She denies any low back pain. She states it feels like when she had diverticulitis in the past. She states nothing she does makes it hurt more, nothing makes it feel better. She states she called her gastroenterologist and they could not get her scheduled for CT scan until next week. She states she couldn't wait that long.  PCP Colette Ribas, MD GI Dr Karilyn Cota  Past Medical History:  Diagnosis Date  . Diabetes mellitus without complication (HCC)   . Diverticulitis   . Thyroid disease     Patient Active Problem List   Diagnosis Date Noted  . Diverticulitis 07/27/2015  . Obesity 07/27/2015  . Hypothyroidism 07/27/2015  . Diabetes mellitus type 2, diet-controlled (HCC) 07/27/2015  . Diverticulitis of colon with perforation 07/27/2015  . Leukocytosis 07/27/2015  . Constipation 07/27/2015  . Nausea 07/27/2015  . Abdominal pain 07/27/2015  . Upper airway cough syndrome 05/20/2013    Past Surgical History:  Procedure Laterality Date  . ABDOMINAL HYSTERECTOMY    . APPENDECTOMY    . CHOLECYSTECTOMY    . COLONOSCOPY N/A 11/27/2014   Procedure: COLONOSCOPY;  Surgeon: Malissa Hippo, MD;  Location: [facility];  Service: Endoscopy;  Laterality: N/A;  220 - moved to 1:55 - Ann to notify pt  . COLONOSCOPY W/ POLYPECTOMY     2014  Dr. Fonda Kinder    OB History    No data available       Home Medications    Prior to Admission medications   Medication Sig Start Date End Date Taking? Authorizing Provider  aspirin EC 81 MG tablet Take 81 mg by mouth daily.   Yes Historical Provider, MD  docusate sodium (COLACE) 100 MG capsule Take 100 mg by mouth 2 (two) times daily.   Yes Historical Provider, MD  Levothyroxine Sodium 150 MCG CAPS Take 150 mcg by mouth daily before breakfast.    Yes Historical Provider, MD  Lutein 20 MG TABS Take 20 mg by mouth daily.   Yes Historical Provider, MD  metFORMIN (GLUCOPHAGE) 500 MG tablet Take 500 mg by mouth 2 (two) times daily with a meal.   Yes Historical Provider, MD  amoxicillin-clavulanate (AUGMENTIN) 875-125 MG tablet Take 1 tablet by mouth 2 (two) times daily. 08/13/15   Len Blalock, NP  ciprofloxacin (CIPRO) 500 MG tablet Take 1 tablet (500 mg total) by mouth 2 (two) times daily. 07/20/16   Devoria Albe, MD  Melatonin 10 MG TBCR Take by mouth.    Historical Provider, MD  metroNIDAZOLE (FLAGYL) 500 MG tablet Take 1 tablet (500 mg total) by mouth 3 (three) times daily. 07/20/16   Devoria Albe, MD    Family History History reviewed. No pertinent family history.  Social History Social History  Substance Use Topics  . Smoking status: Former Smoker  Packs/day: 1.00    Years: 15.00    Types: Cigarettes    Quit date: 06/06/2005  . Smokeless tobacco: Never Used  . Alcohol use No  employed   Allergies   Morphine and related   Review of Systems Review of Systems  All other systems reviewed and are negative.    Physical Exam Updated Vital Signs BP 142/96 (BP Location: Left Arm)   Pulse 96   Temp 98.2 F (36.8 C) (Oral)   Resp 16   Ht 5\' 4"  (1.626 m)   Wt 219 lb 14.4 oz (99.7 kg)   SpO2 95%   BMI 37.75 kg/m   Vital signs normal    Physical Exam  Constitutional: She is oriented to person, place, and time. She appears well-developed and well-nourished.  Non-toxic  appearance. She does not appear ill. No distress.  HENT:  Head: Normocephalic and atraumatic.  Right Ear: External ear normal.  Left Ear: External ear normal.  Nose: Nose normal. No mucosal edema or rhinorrhea.  Mouth/Throat: Mucous membranes are dry. No dental abscesses or uvula swelling.  Eyes: Conjunctivae and EOM are normal. Pupils are equal, round, and reactive to light.  Neck: Normal range of motion and full passive range of motion without pain. Neck supple.  Cardiovascular: Normal rate, regular rhythm and normal heart sounds.  Exam reveals no gallop and no friction rub.   No murmur heard. Pulmonary/Chest: Effort normal and breath sounds normal. No respiratory distress. She has no wheezes. She has no rhonchi. She has no rales. She exhibits no tenderness and no crepitus.  Abdominal: Soft. Normal appearance and bowel sounds are normal. She exhibits no distension. There is tenderness. There is no rebound and no guarding.    Tender diffusely, but worse in the lower abdomen. No guarding or rebound.   Musculoskeletal: Normal range of motion. She exhibits no edema or tenderness.  Moves all extremities well.   Neurological: She is alert and oriented to person, place, and time. She has normal strength. No cranial nerve deficit.  Skin: Skin is warm, dry and intact. No rash noted. No erythema. No pallor.  Psychiatric: She has a normal mood and affect. Her speech is normal and behavior is normal. Her mood appears not anxious.  Nursing note and vitals reviewed.    ED Treatments / Results  Labs (all labs ordered are listed, but only abnormal results are displayed) Results for orders placed or performed during the hospital encounter of 07/20/16  Comprehensive metabolic panel  Result Value Ref Range   Sodium 139 135 - 145 mmol/L   Potassium 3.8 3.5 - 5.1 mmol/L   Chloride 100 (L) 101 - 111 mmol/L   CO2 25 22 - 32 mmol/L   Glucose, Bld 152 (H) 65 - 99 mg/dL   BUN 19 6 - 20 mg/dL    Creatinine, Ser 4.780.82 0.44 - 1.00 mg/dL   Calcium 9.4 8.9 - 29.510.3 mg/dL   Total Protein 7.6 6.5 - 8.1 g/dL   Albumin 4.1 3.5 - 5.0 g/dL   AST 18 15 - 41 U/L   ALT 20 14 - 54 U/L   Alkaline Phosphatase 35 (L) 38 - 126 U/L   Total Bilirubin 1.1 0.3 - 1.2 mg/dL   GFR calc non Af Amer >60 >60 mL/min   GFR calc Af Amer >60 >60 mL/min   Anion gap 14 5 - 15  CBC with Differential  Result Value Ref Range   WBC 11.7 (H) 4.0 - 10.5 K/uL  RBC 4.43 3.87 - 5.11 MIL/uL   Hemoglobin 14.1 12.0 - 15.0 g/dL   HCT 16.1 09.6 - 04.5 %   MCV 93.5 78.0 - 100.0 fL   MCH 31.8 26.0 - 34.0 pg   MCHC 34.1 30.0 - 36.0 g/dL   RDW 40.9 81.1 - 91.4 %   Platelets 231 150 - 400 K/uL   Neutrophils Relative % 73 %   Neutro Abs 8.6 (H) 1.7 - 7.7 K/uL   Lymphocytes Relative 18 %   Lymphs Abs 2.1 0.7 - 4.0 K/uL   Monocytes Relative 7 %   Monocytes Absolute 0.8 0.1 - 1.0 K/uL   Eosinophils Relative 2 %   Eosinophils Absolute 0.2 0.0 - 0.7 K/uL   Basophils Relative 0 %   Basophils Absolute 0.0 0.0 - 0.1 K/uL  Urinalysis, Routine w reflex microscopic  Result Value Ref Range   Color, Urine YELLOW YELLOW   APPearance HAZY (A) CLEAR   Specific Gravity, Urine 1.023 1.005 - 1.030   pH 5.0 5.0 - 8.0   Glucose, UA NEGATIVE NEGATIVE mg/dL   Hgb urine dipstick NEGATIVE NEGATIVE   Bilirubin Urine NEGATIVE NEGATIVE   Ketones, ur 20 (A) NEGATIVE mg/dL   Protein, ur NEGATIVE NEGATIVE mg/dL   Nitrite NEGATIVE NEGATIVE   Leukocytes, UA SMALL (A) NEGATIVE   RBC / HPF 0-5 0 - 5 RBC/hpf   WBC, UA 0-5 0 - 5 WBC/hpf   Bacteria, UA RARE (A) NONE SEEN   Mucous PRESENT    Laboratory interpretation all normal except ? uti     EKG  EKG Interpretation None       Radiology Ct Abdomen Pelvis W Contrast  Result Date: 07/20/2016 CLINICAL DATA:  Lower abdominal pain starting on Monday. Similar to diverticulitis in the past. EXAM: CT ABDOMEN AND PELVIS WITH CONTRAST TECHNIQUE: Multidetector CT imaging of the abdomen and  pelvis was performed using the standard protocol following bolus administration of intravenous contrast. CONTRAST:  ISOVUE-300 IOPAMIDOL (ISOVUE-300) INJECTION 61% COMPARISON:  09/04/2015 FINDINGS: Lower chest: Lung bases are clear. Hepatobiliary: No focal liver abnormality is seen. Status post cholecystectomy. No biliary dilatation. Pancreas: Unremarkable. No pancreatic ductal dilatation or surrounding inflammatory changes. Spleen: Normal in size without focal abnormality. Adrenals/Urinary Tract: No adrenal gland nodules. Kidneys are symmetrical with symmetrical nephrograms. No hydronephrosis or hydroureter. Bladder is decompressed. Stomach/Bowel: Stomach and small bowel are unremarkable. Colon is not abnormally distended. Diverticulosis of the sigmoid colon. Infiltration around the sigmoid colon surrounding a prominent diverticulum consistent with acute diverticulitis. No abscess. Appendix is surgically absent. Vascular/Lymphatic: No significant vascular findings are present. No enlarged abdominal or pelvic lymph nodes. Reproductive: Status post hysterectomy. No adnexal masses. Other: No free air or free fluid in the abdomen. Abdominal wall musculature appears intact. Musculoskeletal: Degenerative changes in the spine. No destructive bone lesions. IMPRESSION: Diverticulosis of the sigmoid colon with infiltrative changes consistent with acute diverticulitis. No abscess. Electronically Signed   By: Burman Nieves M.D.   On: 07/20/2016 04:21    Procedures Procedures (including critical care time)  Medications Ordered in ED Medications  sodium chloride 0.9 % bolus 1,000 mL (0 mLs Intravenous Stopped 07/20/16 0415)  sodium chloride 0.9 % bolus 500 mL (500 mLs Intravenous New Bag/Given 07/20/16 0415)  ondansetron (ZOFRAN) injection 4 mg (4 mg Intravenous Given 07/20/16 0319)  fentaNYL (SUBLIMAZE) injection 50 mcg (50 mcg Intravenous Given 07/20/16 0319)  iopamidol (ISOVUE-300) 61 % injection 100 mL (100  mLs Intravenous Contrast Given 07/20/16 0405)  Initial Impression / Assessment and Plan / ED Course  I have reviewed the triage vital signs and the nursing notes.  Pertinent labs & imaging results that were available during my care of the patient were reviewed by me and considered in my medical decision making (see chart for details).   Patient was started on IV fluids and IV pain and nausea medication. CT of the abdomen and pelvis was ordered.  We discussed her lab results and her CT results. We looked at her CT scan and patient is noted to have a moderate amount of stool in her sigmoid colon which would correspond to where her pain is located. We discussed treatment for constipation. I gave patient a prescription for Cipro and Flagyl if that does not improve her pain she can start it for possible early diverticulitis. She should be rechecked if she gets worse such as high fever, vomiting, or worsening pain.  Final Clinical Impressions(s) / ED Diagnoses   Final diagnoses:  Lower abdominal pain    New Prescriptions New Prescriptions   CIPROFLOXACIN (CIPRO) 500 MG TABLET    Take 1 tablet (500 mg total) by mouth 2 (two) times daily.   METRONIDAZOLE (FLAGYL) 500 MG TABLET    Take 1 tablet (500 mg total) by mouth 3 (three) times daily.    Plan discharge  Devoria Albe, MD, Concha Pyo, MD 07/20/16 [phone]

## 2016-07-20 NOTE — ED Notes (Signed)
Pt ambulatory to waiting room. Pt verbalized understanding of discharge instructions.   

## 2016-07-20 NOTE — ED Triage Notes (Signed)
Pt states she is having lower abd pain that started Monday.  Pt states "the last time it felt like this I had perforated my bowel".  Pt states this feels the same

## 2016-07-20 NOTE — Discharge Instructions (Signed)
Get miralax and put one dose or 17 g in 8 ounces of water,  take 1 dose every 30 minutes for 2-3 hours or until you  get good results and then once or twice daily to prevent constipation. If your symptoms aren't improving after using the miralax, start the antibiotics for possible early diverticulitis. Drink plenty of fluids. Look at the diet recommendations for diverticulitis vs. Diverticulosis. Recheck if you get a fever, vomiting or your pain gets worse.

## 2016-07-27 ENCOUNTER — Ambulatory Visit (HOSPITAL_COMMUNITY): Payer: No Typology Code available for payment source

## 2016-11-09 ENCOUNTER — Other Ambulatory Visit (HOSPITAL_COMMUNITY): Payer: Self-pay | Admitting: Internal Medicine

## 2016-11-09 DIAGNOSIS — Z1231 Encounter for screening mammogram for malignant neoplasm of breast: Secondary | ICD-10-CM

## 2016-12-01 ENCOUNTER — Ambulatory Visit (INDEPENDENT_AMBULATORY_CARE_PROVIDER_SITE_OTHER): Payer: No Typology Code available for payment source | Admitting: Internal Medicine

## 2016-12-30 ENCOUNTER — Ambulatory Visit (HOSPITAL_COMMUNITY)
Admission: RE | Admit: 2016-12-30 | Discharge: 2016-12-30 | Disposition: A | Discharge status: Home / Self Care | Payer: No Typology Code available for payment source | Source: Ambulatory Visit | Attending: Internal Medicine | Admitting: Internal Medicine

## 2016-12-30 DIAGNOSIS — Z1231 Encounter for screening mammogram for malignant neoplasm of breast: Secondary | ICD-10-CM | POA: Diagnosis not present

## 2017-02-01 ENCOUNTER — Encounter (HOSPITAL_COMMUNITY): Payer: Self-pay

## 2017-02-01 ENCOUNTER — Emergency Department (HOSPITAL_COMMUNITY)
Admission: EM | Admit: 2017-02-01 | Discharge: 2017-02-01 | Disposition: A | Discharge status: Home / Self Care | Payer: No Typology Code available for payment source | Attending: Emergency Medicine | Admitting: Emergency Medicine

## 2017-02-01 ENCOUNTER — Emergency Department (HOSPITAL_COMMUNITY): Payer: No Typology Code available for payment source

## 2017-02-01 DIAGNOSIS — E119 Type 2 diabetes mellitus without complications: Secondary | ICD-10-CM | POA: Insufficient documentation

## 2017-02-01 DIAGNOSIS — E039 Hypothyroidism, unspecified: Secondary | ICD-10-CM | POA: Insufficient documentation

## 2017-02-01 DIAGNOSIS — Z87891 Personal history of nicotine dependence: Secondary | ICD-10-CM | POA: Insufficient documentation

## 2017-02-01 DIAGNOSIS — M5116 Intervertebral disc disorders with radiculopathy, lumbar region: Secondary | ICD-10-CM | POA: Insufficient documentation

## 2017-02-01 DIAGNOSIS — M48061 Spinal stenosis, lumbar region without neurogenic claudication: Secondary | ICD-10-CM | POA: Insufficient documentation

## 2017-02-01 DIAGNOSIS — Z79899 Other long term (current) drug therapy: Secondary | ICD-10-CM | POA: Diagnosis not present

## 2017-02-01 DIAGNOSIS — Z7984 Long term (current) use of oral hypoglycemic drugs: Secondary | ICD-10-CM | POA: Diagnosis not present

## 2017-02-01 DIAGNOSIS — M545 Low back pain: Secondary | ICD-10-CM | POA: Diagnosis present

## 2017-02-01 DIAGNOSIS — Z7982 Long term (current) use of aspirin: Secondary | ICD-10-CM | POA: Insufficient documentation

## 2017-02-01 LAB — URINALYSIS, ROUTINE W REFLEX MICROSCOPIC
Bilirubin Urine: NEGATIVE
Glucose, UA: >=500 mg/dL — AB
Hgb urine dipstick: NEGATIVE
KETONES UR: NEGATIVE mg/dL
Leukocytes, UA: NEGATIVE
Nitrite: NEGATIVE
PROTEIN: NEGATIVE mg/dL
SQUAMOUS EPITHELIAL / LPF: NONE SEEN
Specific Gravity, Urine: 1.023 (ref 1.005–1.030)
pH: 5 (ref 5.0–8.0)

## 2017-02-01 LAB — CBC WITH DIFFERENTIAL/PLATELET
BASOS ABS: 0 10*3/uL (ref 0.0–0.1)
BASOS PCT: 0 %
Eosinophils Absolute: 0.2 10*3/uL (ref 0.0–0.7)
Eosinophils Relative: 3 %
HEMATOCRIT: 40.7 % (ref 36.0–46.0)
HEMOGLOBIN: 13.4 g/dL (ref 12.0–15.0)
Lymphocytes Relative: 29 %
Lymphs Abs: 2.4 10*3/uL (ref 0.7–4.0)
MCH: 30.8 pg (ref 26.0–34.0)
MCHC: 32.9 g/dL (ref 30.0–36.0)
MCV: 93.6 fL (ref 78.0–100.0)
Monocytes Absolute: 0.5 10*3/uL (ref 0.1–1.0)
Monocytes Relative: 6 %
NEUTROS ABS: 5.1 10*3/uL (ref 1.7–7.7)
NEUTROS PCT: 62 %
Platelets: 234 10*3/uL (ref 150–400)
RBC: 4.35 MIL/uL (ref 3.87–5.11)
RDW: 13.9 % (ref 11.5–15.5)
WBC: 8.3 10*3/uL (ref 4.0–10.5)

## 2017-02-01 LAB — COMPREHENSIVE METABOLIC PANEL
ALBUMIN: 4.2 g/dL (ref 3.5–5.0)
ALT: 25 U/L (ref 14–54)
AST: 22 U/L (ref 15–41)
Alkaline Phosphatase: 41 U/L (ref 38–126)
Anion gap: 11 (ref 5–15)
BILIRUBIN TOTAL: 0.8 mg/dL (ref 0.3–1.2)
BUN: 18 mg/dL (ref 6–20)
CHLORIDE: 105 mmol/L (ref 101–111)
CO2: 23 mmol/L (ref 22–32)
Calcium: 9.3 mg/dL (ref 8.9–10.3)
Creatinine, Ser: 0.81 mg/dL (ref 0.44–1.00)
GFR calc Af Amer: >60 mL/min (ref >60)
GFR calc non Af Amer: >60 mL/min (ref >60)
GLUCOSE: 143 mg/dL — AB (ref 65–99)
POTASSIUM: 4.1 mmol/L (ref 3.5–5.1)
Sodium: 139 mmol/L (ref 135–145)
TOTAL PROTEIN: 7.3 g/dL (ref 6.5–8.1)

## 2017-02-01 LAB — RAPID URINE DRUG SCREEN, HOSP PERFORMED
Amphetamines: NOT DETECTED
BARBITURATES: NOT DETECTED
BENZODIAZEPINES: NOT DETECTED
COCAINE: NOT DETECTED
OPIATES: POSITIVE — AB
TETRAHYDROCANNABINOL: NOT DETECTED

## 2017-02-01 MED ORDER — LORAZEPAM 2 MG/ML IJ SOLN
2.0000 mg | Freq: Once | INTRAMUSCULAR | Status: DC
  Filled 2017-02-01: qty 1

## 2017-02-01 MED ORDER — OXYCODONE-ACETAMINOPHEN 5-325 MG PO TABS: 1.0000 | ORAL_TABLET | ORAL | 0 refills | Status: DC | PRN

## 2017-02-01 MED ORDER — LORAZEPAM 2 MG/ML IJ SOLN
2.0000 mg | Freq: Once | INTRAMUSCULAR | Status: AC
  Administered 2017-02-01: 2 mg via INTRAMUSCULAR

## 2017-02-01 MED ORDER — PREDNISONE 50 MG PO TABS
60.0000 mg | ORAL_TABLET | Freq: Once | ORAL | Status: AC
  Administered 2017-02-01: 08:00:00 60 mg via ORAL
  Filled 2017-02-01: qty 1

## 2017-02-01 MED ORDER — ONDANSETRON 8 MG PO TBDP
8.0000 mg | ORAL_TABLET | Freq: Once | ORAL | Status: AC
  Administered 2017-02-01: 8 mg via ORAL
  Filled 2017-02-01: qty 1

## 2017-02-01 MED ORDER — HYDROMORPHONE HCL 2 MG/ML IJ SOLN
2.0000 mg | Freq: Once | INTRAMUSCULAR | Status: AC
  Administered 2017-02-01: 2 mg via INTRAMUSCULAR
  Filled 2017-02-01: qty 1

## 2017-02-01 MED ORDER — DIAZEPAM 10 MG PO TABS: 10.0000 mg | ORAL_TABLET | Freq: Four times a day (QID) | ORAL | 0 refills | Status: DC | PRN

## 2017-02-01 MED ORDER — PREDNISONE 10 MG PO TABS: 20.0000 mg | ORAL_TABLET | Freq: Two times a day (BID) | ORAL | 0 refills | Status: DC

## 2017-02-01 NOTE — Discharge Instructions (Signed)
Rest as much as possible.  Do not drive, or work, until you see the neurosurgeon.  Try using heat on the sore areas, 3 or 4 times a day for 30-60 minutes.  If your condition abruptly worsens, you began to be unable to urinate, have urine leaking or leaking of stool contents, [facility].

## 2017-02-01 NOTE — ED Notes (Signed)
Pt denies any leg swelling.  Denies any new problems with with bowels or bladder.   Reports some urinary incontinence that started before the back pain.

## 2017-02-01 NOTE — ED Provider Notes (Signed)
[facility] Provider Note   CSN: [id number] Arrival date & time: 02/01/17  7829     History   Chief Complaint Chief Complaint  Patient presents with  . Back Pain    HPI [name] is a 61 y.o. female.  She presents for evaluation of left lower back pain radiating to left buttock and entire left leg, for 3 days, unable to find improvement after taking narcotic pain medicines she borrowed from someone and Flexeril that she had at home.  She denies bowel or bladder incontinence.  She denies fever, chills, weakness, paresthesias, inability to walk.  Prior "ruptured disc,", unknown location of her lower back.  No prior surgery.  No recent trauma.  There are no other known modifying factors.   HPI  Past Medical History:  Diagnosis Date  . Diabetes mellitus without complication (HCC)   . Diverticulitis   . Thyroid disease     Patient Active Problem List   Diagnosis Date Noted  . Diverticulitis 07/27/2015  . Obesity 07/27/2015  . Hypothyroidism 07/27/2015  . Diabetes mellitus type 2, diet-controlled (HCC) 07/27/2015  . Diverticulitis of colon with perforation 07/27/2015  . Leukocytosis 07/27/2015  . Constipation 07/27/2015  . Nausea 07/27/2015  . Abdominal pain 07/27/2015  . Upper airway cough syndrome 05/20/2013    Past Surgical History:  Procedure Laterality Date  . ABDOMINAL HYSTERECTOMY    . APPENDECTOMY    . CHOLECYSTECTOMY    . COLONOSCOPY N/A 11/27/2014   Procedure: COLONOSCOPY;  Surgeon: Malissa Hippo, MD;  Location: [facility];  Service: Endoscopy;  Laterality: N/A;  220 - moved to 1:55 - Ann to notify pt  . COLONOSCOPY W/ POLYPECTOMY     2014 Dr. Fonda Kinder    OB History    No data available       Home Medications    Prior to Admission medications   Medication Sig Start Date End Date Taking? Authorizing Provider  aspirin EC 81 MG tablet Take 81 mg by mouth daily.   Yes [provider]  cyclobenzaprine (FLEXERIL) 10 MG tablet  Take 0.5 tablets by mouth 3 (three) times daily. 12/16/16  Yes [provider]  docusate sodium (COLACE) 100 MG capsule Take 100 mg by mouth 2 (two) times daily.   Yes [provider]  levothyroxine (SYNTHROID, LEVOTHROID) 150 MCG tablet Take 150 mcg by mouth daily. 01/11/17  Yes [provider]  Lutein 20 MG TABS Take 20 mg by mouth daily.   Yes [provider]  Melatonin 10 MG TBCR Take 1 tablet by mouth at bedtime.    Yes [provider]  XIGDUO XR 10-500 MG TB24 Take 1 tablet by mouth daily. 01/06/17  Yes [provider]    Family History No family history on file.  Social History Social History  Substance Use Topics  . Smoking status: Former Smoker    Packs/day: 1.00    Years: 15.00    Types: Cigarettes    Quit date: 06/06/2005  . Smokeless tobacco: Never Used  . Alcohol use No     Allergies   Morphine and related   Review of Systems Review of Systems  All other systems reviewed and are negative.    Physical Exam Updated Vital Signs BP 131/71   Pulse 79   Temp 98.4 F (36.9 C) (Oral)   Resp 18   Ht 5\' 4"  (1.626 m)   Wt 98.9 kg (218 lb)   SpO2 (!) 86%   BMI  37.42 kg/m   Physical Exam  Constitutional: She is oriented to person, place, and time. She appears well-developed and well-nourished. She appears distressed (Uncomfortable).  HENT:  Head: Normocephalic and atraumatic.  Eyes: Pupils are equal, round, and reactive to light. Conjunctivae and EOM are normal.  Neck: Normal range of motion and phonation normal. Neck supple.  Cardiovascular: Normal rate.   Pulmonary/Chest: Effort normal.  Musculoskeletal:  Mild left lumbar tenderness.  Normal plantar strength both feet bilaterally.  Normal sensation left leg.  Neurological: She is alert and oriented to person, place, and time. She exhibits normal muscle tone.  Skin: Skin is warm and dry.  Psychiatric: She has a normal mood and affect. Her behavior is normal.  Judgment and thought content normal.  Nursing note and vitals reviewed.    ED Treatments / Results  Labs (all labs ordered are listed, but only abnormal results are displayed) Labs Reviewed  COMPREHENSIVE METABOLIC PANEL - Abnormal; Notable for the following:       Result Value   Glucose, Bld 143 (*)    All other components within normal limits  URINALYSIS, ROUTINE W REFLEX MICROSCOPIC - Abnormal; Notable for the following:    Glucose, UA >=500 (*)    Bacteria, UA RARE (*)    All other components within normal limits  RAPID URINE DRUG SCREEN, HOSP PERFORMED - Abnormal; Notable for the following:    Opiates POSITIVE (*)    All other components within normal limits  CBC WITH DIFFERENTIAL/PLATELET    EKG  EKG Interpretation None       Radiology Mr Lumbar Spine Wo Contrast  Result Date: 02/01/2017 CLINICAL DATA:  Radiculopathy for less in 6 weeks. Pain radiates down the left leg since Thursday. EXAM: MRI LUMBAR SPINE WITHOUT CONTRAST TECHNIQUE: Multiplanar, multisequence MR imaging of the lumbar spine was performed. No intravenous contrast was administered. COMPARISON:  01/12/2010 FINDINGS: Segmentation:  Standard. Alignment:  Physiologic. Vertebrae:  No fracture, evidence of discitis, or bone lesion. Conus medullaris: Extends to the L1-2 disc level and appears normal. Small Tarlov cysts at S2 without bony remodeling. Paraspinal and other soft tissues: Small right lower pole renal cyst. Disc levels: T12- L1: Spondylosis.  No herniation or impingement L1-L2: Spondylosis.  No herniation or impingement L2-L3: Spondylosis.  No herniation or impingement L3-L4: Disc desiccation and mild leftward disc bulging, similar prior. No impingement L4-L5: Disc desiccation and narrowing with bulging and shallow left inferior foraminal protrusion. Facet arthropathy with spurring. Left-sided protrusion may contact but not compress the L4 nerve root. L5-S1:Very large disc extrusion filling most of the  canal, left eccentric and superiorly migrating. The thecal sac is compressed and there is visible impingement on both S1 nerve roots, particularly severe on the left. The herniation extends superiorly enough to impinge on the L5 nerve root in the left subarticular recess. IMPRESSION: 1. L5-S1 very large disc extrusion with high-grade spinal stenosis. There is compression of the left more than right S1 nerve roots and the left L5 nerve root. 2. Noncompressive degenerative changes elsewhere as described above. Electronically Signed   By: Marnee SpringJonathon  Watts M.D.   On: 02/01/2017 09:17    Procedures Procedures (including critical care time)  Medications Ordered in ED Medications  HYDROmorphone (DILAUDID) injection 2 mg (2 mg Intramuscular Given 02/01/17 0817)  predniSONE (DELTASONE) tablet 60 mg (60 mg Oral Given 02/01/17 0818)  ondansetron (ZOFRAN-ODT) disintegrating tablet 8 mg (8 mg Oral Given 02/01/17 0819)  LORazepam (ATIVAN) injection 2 mg (2 mg Intramuscular Given 02/01/17  1610)     Initial Impression / Assessment and Plan / ED Course  I have reviewed the triage vital signs and the nursing notes.  Pertinent labs & imaging results that were available during my care of the patient were reviewed by me and considered in my medical decision making (see chart for details).      Patient Vitals for the past 24 hrs:  BP Temp Temp src Pulse Resp SpO2 Height Weight  02/01/17 1230 131/71 - - 79 - (!) 86 % - -  02/01/17 1215 - - - 78 - 92 % - -  02/01/17 1200 127/85 - - 80 - 97 % - -  02/01/17 1130 132/70 - - 68 - 100 % - -  02/01/17 1115 - - - 75 - 99 % - -  02/01/17 1100 123/86 - - 71 - 99 % - -  02/01/17 1030 127/68 - - 71 - 99 % - -  02/01/17 1008 - - - 67 - 100 % - -  02/01/17 1006 - - - 83 - (!) 82 % - -  02/01/17 1000 126/73 - - 84 - (!) 83 % - -  02/01/17 0930 (!) 141/72 - - 83 - - - -  02/01/17 0909 - - - 78 - 94 % - -  02/01/17 0908 (!) 141/67 - - - - - - -  02/01/17 0708 (!) 155/124  98.4 F (36.9 C) Oral 84 18 100 % - -  02/01/17 0707 - - - - - - 5\' 4"  (1.626 m) 98.9 kg (218 lb)    1:02 PM Reevaluation with update and discussion. After initial assessment and treatment, an updated evaluation reveals patient states she is more comfortable now unable to move around in the bed without significant pain.  Findings discussed with patient and husband, questions answered. Auguste Tebbetts L      Final Clinical Impressions(s) / ED Diagnoses   Final diagnoses:  Lumbar disc herniation with radiculopathy  Spinal stenosis of lumbar region, unspecified whether neurogenic claudication present    Evaluation consistent with lumbar radiculopathy, secondary to large 5 S1 disc herniation.  Spinal stenosis is present.  No evidence for cauda equina syndrome.  Patient's symptoms improved with treatment, and she is stable for discharge with outpatient management.  Nursing Notes Reviewed/ Care Coordinated Applicable Imaging Reviewed Interpretation of Laboratory Data incorporated into ED treatment  The patient appears reasonably screened and/or stabilized for discharge and I doubt any other medical condition or other [facility] requiring further screening, evaluation, or treatment in the ED at this time prior to discharge.  Plan: Home Medications-continue current medications; Home Treatments-rest, heat; return here if the recommended treatment, does not improve the symptoms; Recommended follow up-neurosurgery follow-up as soon as possible.  No work until seen by neurosurgery.  New Prescriptions New Prescriptions   No medications on file     Mancel Bale, MD 02/01/17 1312

## 2017-02-01 NOTE — ED Triage Notes (Signed)
Pt reports pain in left lower back radiating down left leg since last Thursday.  Denies injury.  Reports has had problems with back pain for " a while."

## 2017-02-01 NOTE — ED Notes (Signed)
Advised patient we needed  Urine specimen. 

## 2017-06-24 IMAGING — CT CT RENAL STONE PROTOCOL
2 of 3 series · 16 of 46 positions shown, 18 images · non-contrast
Comparison: 05/02/2013, 11/14/2011, [DATE].

CLINICAL DATA: 60-year-old presenting with a acute onset of severe
suprapubic spasms which began yesterday associated with nausea and
dysuria. Surgical history includes cholecystectomy, appendectomy and
hysterectomy.

EXAM:
CT ABDOMEN AND PELVIS WITHOUT CONTRAST
TECHNIQUE: Multidetector CT imaging of the abdomen and pelvis was performed
following the standard protocol without IV contrast.

[Series 2: standard/full over (age)lbs 5.0 · axial · 0.94mm/px · z∈[-342,+22]mm · 13 of 85 slices shown, 15 images]
[im 6/85  soft-tissue]
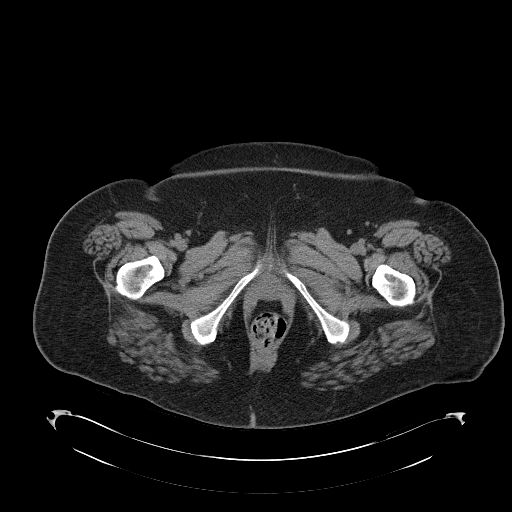
[im 6/85  bone]
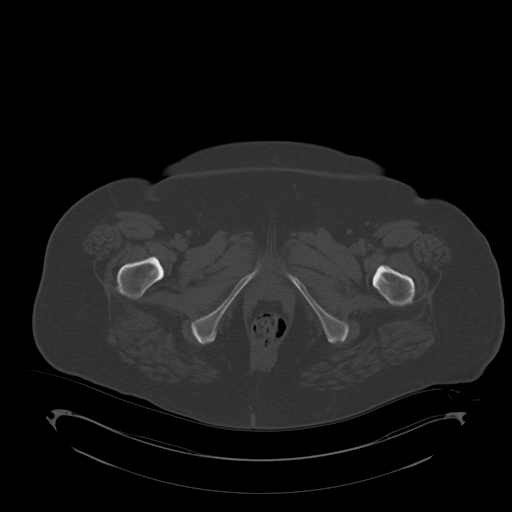
[im 11/85  soft-tissue]
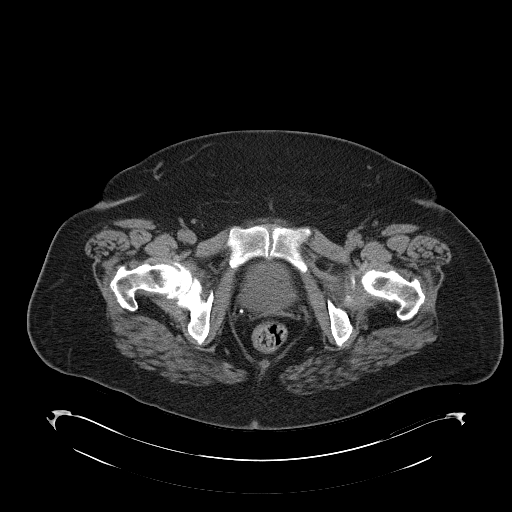
[im 17/85  soft-tissue]
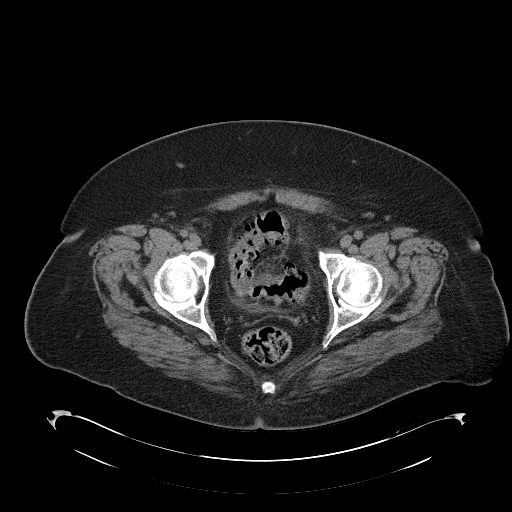
[im 25/85  soft-tissue]
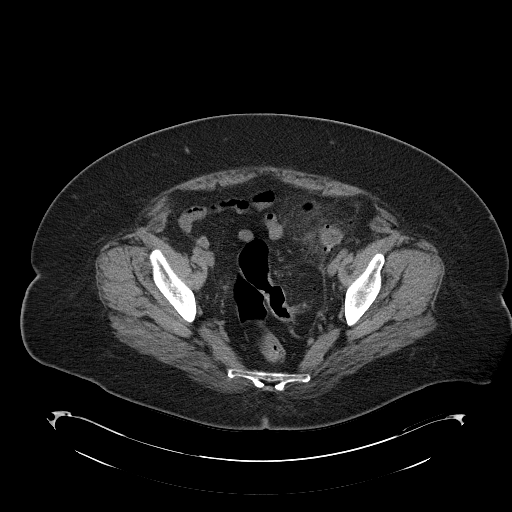
[im 30/85  soft-tissue]
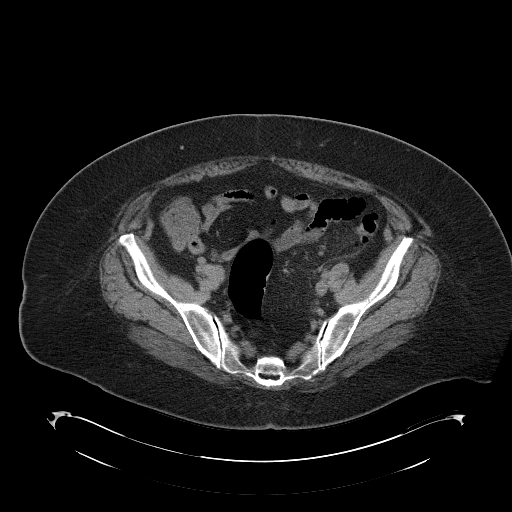
[im 36/85  soft-tissue]
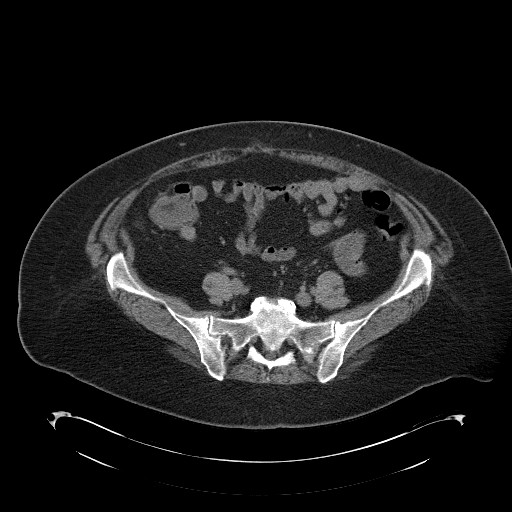
[im 44/85  soft-tissue]
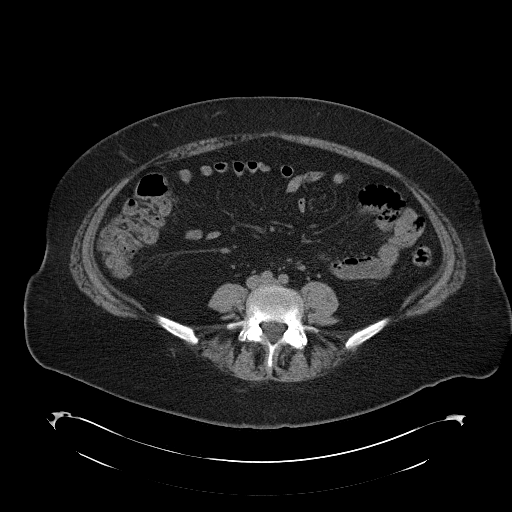
[im 49/85  soft-tissue]
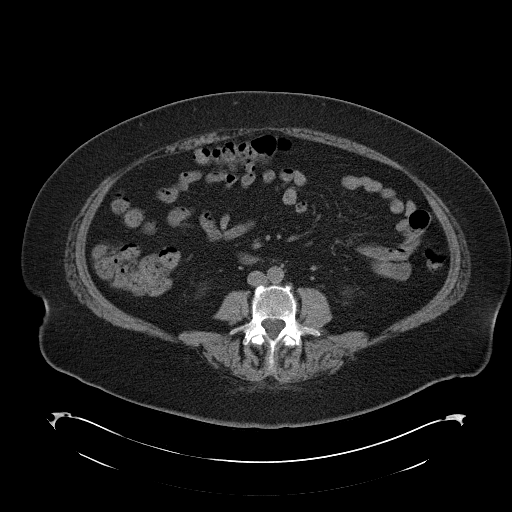
[im 55/85  soft-tissue]
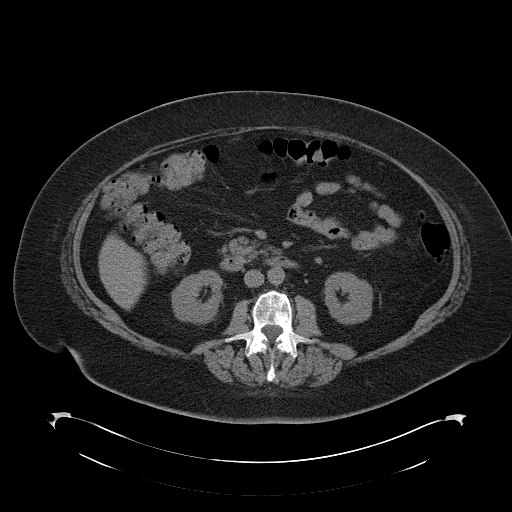
[im 55/85  bone]
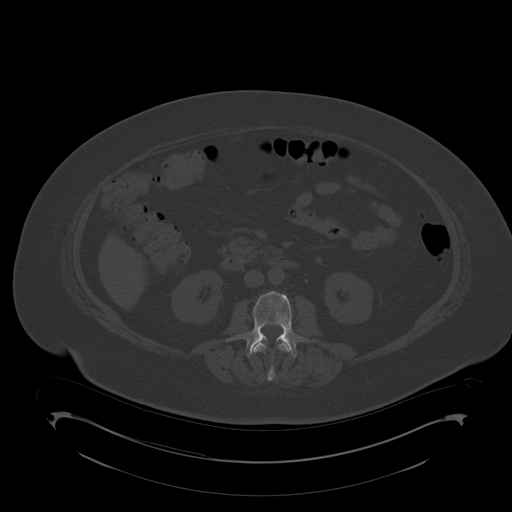
[im 60/85  soft-tissue]
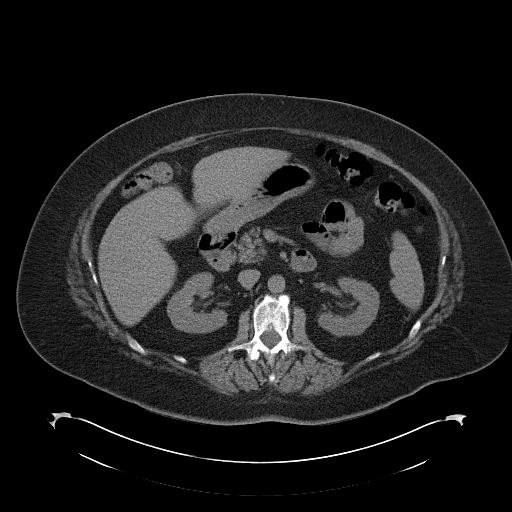
[im 68/85  soft-tissue]
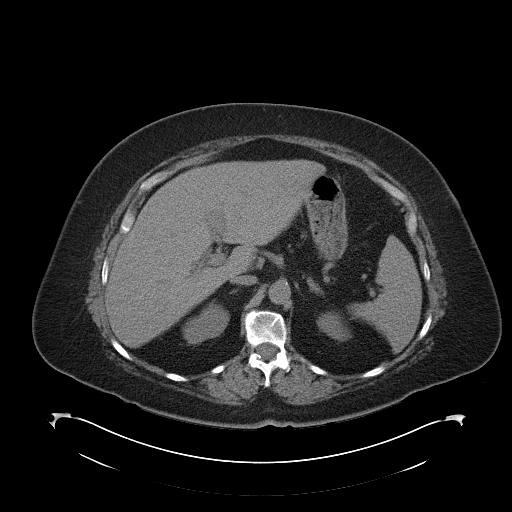
[im 74/85  soft-tissue]
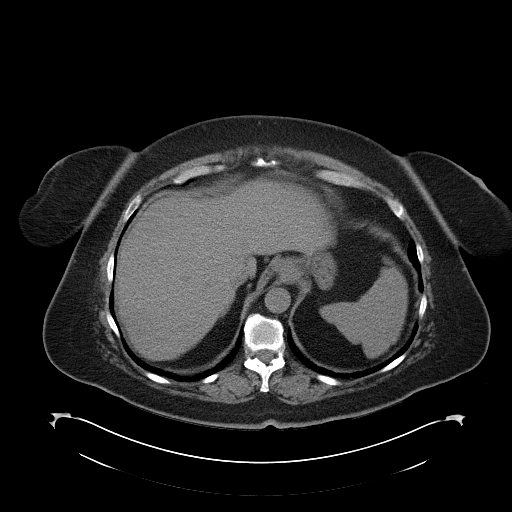
[im 79/85  soft-tissue]
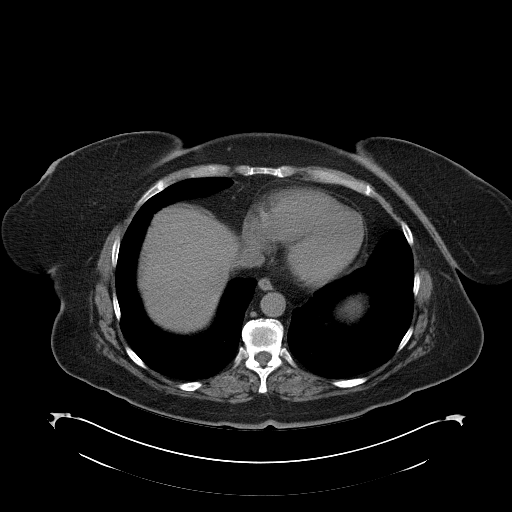

[Series 3: mpr coronal · coronal · 0.83mm/px · 3 of 102 slices shown]
[im 34/102  soft-tissue]
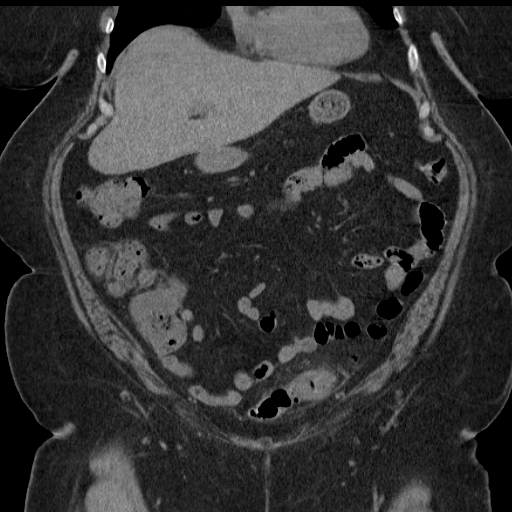
[im 45/102  soft-tissue]
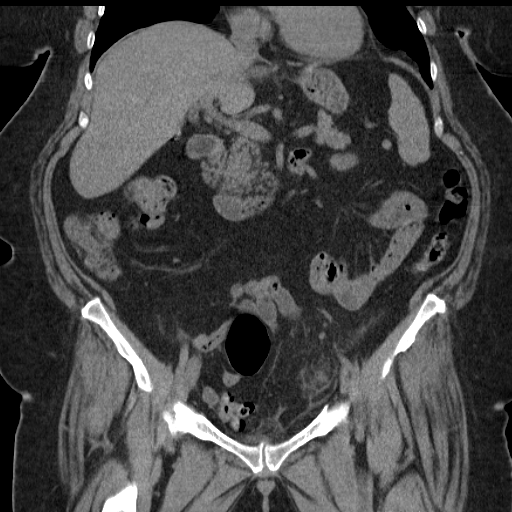
[im 57/102  soft-tissue]
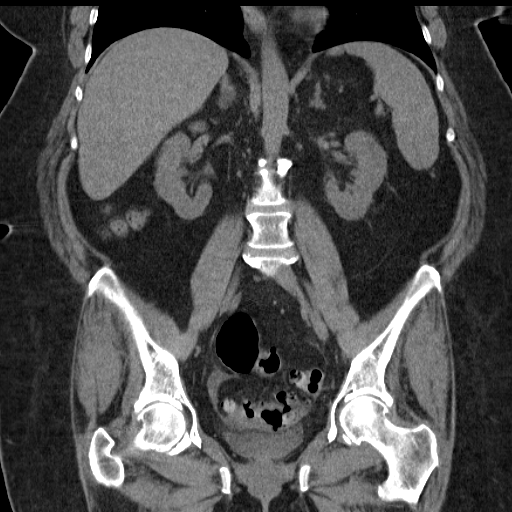

[16 of 46 positions shown; findings below may reference images not displayed]

FINDINGS: Lower chest:  Heart size normal.  Visualized lung bases clear.

Hepatobiliary: Normal unenhanced appearance of the liver.
Gallbladder surgically absent. No biliary ductal dilation.

Pancreas: Mildly atrophic without evidence of mass or peripancreatic
inflammation.

Spleen: Normal in size and appearance. Focus of accessory splenic
tissue medial to the lower pole.

Adrenals/Urinary Tract: Normal appearing adrenal glands. No evidence
of urinary tract calculi or obstruction on either side. Within the
limits of the unenhanced technique, no focal parenchymal abnormality
involving either kidney. Normal-appearing decompressed urinary
bladder.

Stomach/Bowel: Stomach decompressed and unremarkable.
Normal-appearing small bowel. Diverticulosis involving the sigmoid
colon with edema/inflammation surrounding the proximal sigmoid
colon. Solitary extraluminal gas bubble. No abnormal fluid
collection. Lipoma involving the ileocecal valve. Surgically absent
appendix. No ascites.

Vascular/Lymphatic: Mild aortoiliac atherosclerosis without
aneurysm. No pathologic lymphadenopathy.

Reproductive: Surgically absent uterus. No adnexal masses or free
pelvic fluid.

Other: Phleboliths low in both sides of the pelvis.

Musculoskeletal: Moderate degenerative changes in both hips. Chronic
L5-S1 disc protrusion with calcification in the posterior annular
fibers. Spondylosis involving the lower thoracic and upper lumbar
spine. No acute abnormalities.
IMPRESSION: 1. Acute diverticulitis involving the proximal sigmoid colon with
evidence of contained microperforation. No evidence of abscess.
2. Mild pancreatic atrophy.

## 2017-10-10 ENCOUNTER — Encounter (INDEPENDENT_AMBULATORY_CARE_PROVIDER_SITE_OTHER): Payer: Self-pay | Admitting: Internal Medicine

## 2017-10-10 ENCOUNTER — Encounter (INDEPENDENT_AMBULATORY_CARE_PROVIDER_SITE_OTHER): Payer: Self-pay | Admitting: *Deleted

## 2017-10-10 ENCOUNTER — Ambulatory Visit (INDEPENDENT_AMBULATORY_CARE_PROVIDER_SITE_OTHER): Payer: 59 | Admitting: Internal Medicine

## 2017-10-10 VITALS — BP 150/90 | HR 84 | Temp 98.4°F | Ht 64.0 in | Wt 202.4 lb

## 2017-10-10 DIAGNOSIS — K5732 Diverticulitis of large intestine without perforation or abscess without bleeding: Secondary | ICD-10-CM | POA: Diagnosis not present

## 2017-10-10 DIAGNOSIS — R1030 Lower abdominal pain, unspecified: Secondary | ICD-10-CM | POA: Diagnosis not present

## 2017-10-10 MED ORDER — METRONIDAZOLE 500 MG PO TABS: 500.0000 mg | ORAL_TABLET | Freq: Two times a day (BID) | ORAL | 0 refills | Status: DC

## 2017-10-10 MED ORDER — CIPROFLOXACIN HCL 500 MG PO TABS: 500.0000 mg | ORAL_TABLET | Freq: Two times a day (BID) | ORAL | 0 refills | Status: DC

## 2017-10-10 NOTE — Progress Notes (Addendum)
Subjective:    Patient ID: [name], female    DOB: February 27, 1956, 62 y.o.   MRN: [id number]  HPI Here today for f/u. Last seen in February of 2017. Hx of diverticulitis back in 2017 on CT.  She says she is in horrible pain since Saturday. She has lower abdominal pain . She has nausea. No fever. She says the pain is non stop.  She had same pain a while back and nothing showed on CT. She is voiding normal.  She feels terrible.  Rates pain 8/10 No nausea or vomiting.    07/27/2015: CT renal study:  Lower abdominal pain.  IMPRESSION: 1. Acute diverticulitis involving the proximal sigmoid colon with evidence of contained microperforation. No evidence of abscess. 2. Mild pancreatic atrophy.   11/27/2014 Colonoscopy  Indications: Patient is 62 year old Caucasian female who presents with change in her bowel habits. She is to have 3-4 bowel movements per day and now she would go a few days without a bowel movement. Last colonoscopy was 2 years ago with removal of single polyp which is hypoplastic. Family is very significant for CRC. Father was diagnosed with colon carcinoma in his 64s and is diseased and he had 6 siblings and they all had colon cancer. Five are still living.  Impression:  Examination performed to cecum. Multiple diverticula at sigmoid colon along with few more at descending colon. No evidence of colonic polyps or neoplasm. Review of Systems  Past Medical History:  Diagnosis Date  . Diabetes mellitus without complication (HCC)   . Diverticulitis   . Thyroid disease     Past Surgical History:  Procedure Laterality Date  . ABDOMINAL HYSTERECTOMY    . APPENDECTOMY    . CHOLECYSTECTOMY    . COLONOSCOPY N/A 11/27/2014   Procedure: COLONOSCOPY;  Surgeon: Malissa Hippo, MD;  Location: [facility];  Service: Endoscopy;  Laterality: N/A;  220 - moved to 1:55 - Ann to notify pt  . COLONOSCOPY W/ POLYPECTOMY     2014 Dr. Fonda Kinder    Allergies  Allergen  Reactions  . Morphine And Related Nausea And Vomiting    Current Outpatient Medications on File Prior to Visit  Medication Sig Dispense Refill  . aspirin EC 81 MG tablet Take 81 mg by mouth daily.    Marland Kitchen docusate sodium (COLACE) 100 MG capsule Take 100 mg by mouth daily as needed.     Marland Kitchen levothyroxine (SYNTHROID, LEVOTHROID) 150 MCG tablet Take 150 mcg by mouth daily.  1  . lisinopril (PRINIVIL,ZESTRIL) 5 MG tablet Take 5 mg by mouth daily.    . Lutein 20 MG TABS Take 20 mg by mouth daily.    . Melatonin 10 MG TBCR Take 1 tablet by mouth at bedtime.     . metFORMIN (GLUCOPHAGE) 500 MG tablet Take by mouth daily with breakfast.     No current facility-administered medications on file prior to visit.         Objective:   Physical Exam Blood pressure (!) 150/90, pulse 84, temperature 98.4 F (36.9 C), height  (1.626 m), weight 202 lb 6.4 oz (91.8 kg). Alert and oriented. Skin warm and dry. Oral mucosa is moist.   . Sclera anicteric, conjunctivae is pink. Thyroid not enlarged. No cervical lymphadenopathy. Lungs clear. Heart regular rate and rhythm.  Abdomen is soft. Bowel sounds are positive. No hepatomegaly. No abdominal masses felt. Tenderness lower abdomen.  No edema to lower extremities.  Assessment & Plan:  Lower abdominal pain. Possible diverticulitis.  Will cover with Cipro and Flagyl x 14 days. Will  Get a CT abdomen/pelvis tomorrow.  If not better, go to the ED.

## 2017-10-10 NOTE — Patient Instructions (Addendum)
Start the antibiotics. CT abdomen/pelvis.  Advised to go to ED if pain worsens.

## 2017-10-11 ENCOUNTER — Ambulatory Visit (HOSPITAL_COMMUNITY)
Admission: RE | Admit: 2017-10-11 | Discharge: 2017-10-11 | Disposition: A | Discharge status: Home / Self Care | Payer: 59 | Source: Ambulatory Visit | Attending: Internal Medicine | Admitting: Internal Medicine

## 2017-10-11 ENCOUNTER — Other Ambulatory Visit (INDEPENDENT_AMBULATORY_CARE_PROVIDER_SITE_OTHER): Payer: Self-pay | Admitting: Internal Medicine

## 2017-10-11 ENCOUNTER — Telehealth (INDEPENDENT_AMBULATORY_CARE_PROVIDER_SITE_OTHER): Payer: Self-pay | Admitting: Internal Medicine

## 2017-10-11 DIAGNOSIS — Z90710 Acquired absence of both cervix and uterus: Secondary | ICD-10-CM | POA: Insufficient documentation

## 2017-10-11 DIAGNOSIS — I700 Atherosclerosis of aorta: Secondary | ICD-10-CM | POA: Insufficient documentation

## 2017-10-11 DIAGNOSIS — Z9049 Acquired absence of other specified parts of digestive tract: Secondary | ICD-10-CM | POA: Diagnosis not present

## 2017-10-11 DIAGNOSIS — K5732 Diverticulitis of large intestine without perforation or abscess without bleeding: Secondary | ICD-10-CM

## 2017-10-11 DIAGNOSIS — R1030 Lower abdominal pain, unspecified: Secondary | ICD-10-CM | POA: Insufficient documentation

## 2017-10-11 DIAGNOSIS — Q6102 Congenital multiple renal cysts: Secondary | ICD-10-CM | POA: Diagnosis not present

## 2017-10-11 LAB — CBC WITH DIFFERENTIAL/PLATELET
BASOS ABS: 44 {cells}/uL (ref 0–200)
BASOS PCT: 0.3 %
EOS ABS: 74 {cells}/uL (ref 15–500)
EOS PCT: 0.5 %
HEMATOCRIT: 39.6 % (ref 35.0–45.0)
Hemoglobin: 13.7 g/dL (ref 11.7–15.5)
Lymphs Abs: 2337 cells/uL (ref 850–3900)
MCH: 30.9 pg (ref 27.0–33.0)
MCHC: 34.6 g/dL (ref 32.0–36.0)
MCV: 89.4 fL (ref 80.0–100.0)
MONOS PCT: 6.5 %
MPV: 10.6 fL (ref 7.5–12.5)
NEUTROS ABS: 11290 {cells}/uL — AB (ref 1500–7800)
Neutrophils Relative %: 76.8 %
Platelets: 314 10*3/uL (ref 140–400)
RBC: 4.43 10*6/uL (ref 3.80–5.10)
RDW: 13.2 % (ref 11.0–15.0)
TOTAL LYMPHOCYTE: 15.9 %
WBC mixed population: 956 cells/uL — ABNORMAL HIGH (ref 200–950)
WBC: 14.7 10*3/uL — ABNORMAL HIGH (ref 3.8–10.8)

## 2017-10-11 LAB — URINALYSIS
Bilirubin Urine: NEGATIVE
Glucose, UA: NEGATIVE
HGB URINE DIPSTICK: NEGATIVE
NITRITE: POSITIVE — AB
SPECIFIC GRAVITY, URINE: 1.032 (ref 1.001–1.03)

## 2017-10-11 LAB — COMPREHENSIVE METABOLIC PANEL
AG RATIO: 1.6 (calc) (ref 1.0–2.5)
ALBUMIN MSPROF: 4.4 g/dL (ref 3.6–5.1)
ALKALINE PHOSPHATASE (APISO): 42 U/L (ref 33–130)
ALT: 14 U/L (ref 6–29)
AST: 13 U/L (ref 10–35)
BILIRUBIN TOTAL: 1.1 mg/dL (ref 0.2–1.2)
BUN: 23 mg/dL (ref 7–25)
CALCIUM: 9.8 mg/dL (ref 8.6–10.4)
CO2: 27 mmol/L (ref 20–32)
CREATININE: 0.8 mg/dL (ref 0.50–0.99)
Chloride: 98 mmol/L (ref 98–110)
GLOBULIN: 2.7 g/dL (ref 1.9–3.7)
Glucose, Bld: 117 mg/dL (ref 65–139)
POTASSIUM: 4.4 mmol/L (ref 3.5–5.3)
Sodium: 138 mmol/L (ref 135–146)
Total Protein: 7.1 g/dL (ref 6.1–8.1)

## 2017-10-11 MED ORDER — IOPAMIDOL (ISOVUE-300) INJECTION 61%
100.0000 mL | Freq: Once | INTRAVENOUS | Status: AC | PRN
  Administered 2017-10-11: 100 mL via INTRAVENOUS

## 2017-10-11 NOTE — Telephone Encounter (Signed)
Dewayne Hatch, can u put [name] on a recall for a colonoscopy in a couple of months. She has diverticulitis now. There is a strong family hx of colon cancer and I think we need to go ahead and set her up for one. Thanks.

## 2017-10-12 DIAGNOSIS — K5732 Diverticulitis of large intestine without perforation or abscess without bleeding: Secondary | ICD-10-CM | POA: Insufficient documentation

## 2017-10-12 NOTE — Telephone Encounter (Signed)
TCS sch'd 12/28/17 at 1030 (930), patient aware

## 2017-11-10 ENCOUNTER — Telehealth (INDEPENDENT_AMBULATORY_CARE_PROVIDER_SITE_OTHER): Payer: Self-pay | Admitting: *Deleted

## 2017-11-10 ENCOUNTER — Encounter (INDEPENDENT_AMBULATORY_CARE_PROVIDER_SITE_OTHER): Payer: Self-pay | Admitting: *Deleted

## 2017-11-10 NOTE — Telephone Encounter (Signed)
Patient needs suprep 

## 2017-11-13 MED ORDER — SUPREP BOWEL PREP KIT 17.5-3.13-1.6 GM/177ML PO SOLN: 1.0000 | Freq: Once | ORAL | 0 refills | Status: AC

## 2017-12-01 ENCOUNTER — Other Ambulatory Visit (HOSPITAL_COMMUNITY): Payer: Self-pay | Admitting: Internal Medicine

## 2017-12-01 DIAGNOSIS — Z1231 Encounter for screening mammogram for malignant neoplasm of breast: Secondary | ICD-10-CM

## 2017-12-28 ENCOUNTER — Encounter (HOSPITAL_COMMUNITY)
Admission: RE | Disposition: A | Discharge status: Home Health | Payer: Self-pay | Source: Ambulatory Visit | Attending: Internal Medicine

## 2017-12-28 ENCOUNTER — Ambulatory Visit (HOSPITAL_COMMUNITY)
Admission: RE | Admit: 2017-12-28 | Discharge: 2017-12-28 | Disposition: A | Discharge status: Home Health | Payer: 59 | Source: Ambulatory Visit | Attending: Internal Medicine | Admitting: Internal Medicine

## 2017-12-28 ENCOUNTER — Encounter (HOSPITAL_COMMUNITY): Payer: Self-pay | Admitting: Emergency Medicine

## 2017-12-28 ENCOUNTER — Other Ambulatory Visit: Payer: Self-pay

## 2017-12-28 DIAGNOSIS — E119 Type 2 diabetes mellitus without complications: Secondary | ICD-10-CM | POA: Diagnosis not present

## 2017-12-28 DIAGNOSIS — Z79899 Other long term (current) drug therapy: Secondary | ICD-10-CM | POA: Diagnosis not present

## 2017-12-28 DIAGNOSIS — I10 Essential (primary) hypertension: Secondary | ICD-10-CM | POA: Diagnosis not present

## 2017-12-28 DIAGNOSIS — Z800 Family history of malignant neoplasm of digestive organs: Secondary | ICD-10-CM | POA: Insufficient documentation

## 2017-12-28 DIAGNOSIS — E039 Hypothyroidism, unspecified: Secondary | ICD-10-CM | POA: Insufficient documentation

## 2017-12-28 DIAGNOSIS — K5909 Other constipation: Secondary | ICD-10-CM | POA: Diagnosis not present

## 2017-12-28 DIAGNOSIS — Z87891 Personal history of nicotine dependence: Secondary | ICD-10-CM | POA: Insufficient documentation

## 2017-12-28 DIAGNOSIS — K5732 Diverticulitis of large intestine without perforation or abscess without bleeding: Secondary | ICD-10-CM | POA: Insufficient documentation

## 2017-12-28 DIAGNOSIS — Z7982 Long term (current) use of aspirin: Secondary | ICD-10-CM | POA: Insufficient documentation

## 2017-12-28 DIAGNOSIS — Z885 Allergy status to narcotic agent status: Secondary | ICD-10-CM | POA: Insufficient documentation

## 2017-12-28 DIAGNOSIS — Z7984 Long term (current) use of oral hypoglycemic drugs: Secondary | ICD-10-CM | POA: Diagnosis not present

## 2017-12-28 DIAGNOSIS — K5730 Diverticulosis of large intestine without perforation or abscess without bleeding: Secondary | ICD-10-CM | POA: Diagnosis not present

## 2017-12-28 HISTORY — DX: Hypothyroidism, unspecified: E03.9

## 2017-12-28 HISTORY — PX: COLONOSCOPY: SHX5424

## 2017-12-28 HISTORY — DX: Essential (primary) hypertension: I10

## 2017-12-28 LAB — GLUCOSE, CAPILLARY: Glucose-Capillary: 162 mg/dL — ABNORMAL HIGH (ref 70–99)

## 2017-12-28 SURGERY — COLONOSCOPY
Anesthesia: Moderate Sedation

## 2017-12-28 MED ORDER — MEPERIDINE HCL 50 MG/ML IJ SOLN
INTRAMUSCULAR | Status: DC | PRN
  Administered 2017-12-28 (×4): 25 mg via INTRAVENOUS

## 2017-12-28 MED ORDER — MEPERIDINE HCL 50 MG/ML IJ SOLN
INTRAMUSCULAR | Status: AC
  Filled 2017-12-28: qty 1

## 2017-12-28 MED ORDER — DOCUSATE SODIUM 100 MG PO CAPS: 100.0000 mg | ORAL_CAPSULE | Freq: Every day | ORAL | 0 refills | Status: AC

## 2017-12-28 MED ORDER — MIDAZOLAM HCL 5 MG/5ML IJ SOLN
INTRAMUSCULAR | Status: AC
  Filled 2017-12-28: qty 10

## 2017-12-28 MED ORDER — STERILE WATER FOR IRRIGATION IR SOLN
Status: DC | PRN
  Administered 2017-12-28: 09:00:00

## 2017-12-28 MED ORDER — BENEFIBER DRINK MIX PO PACK: 4.0000 g | PACK | Freq: Every day | ORAL | Status: DC

## 2017-12-28 MED ORDER — SODIUM CHLORIDE 0.9 % IV SOLN
INTRAVENOUS | Status: DC
  Administered 2017-12-28: 08:00:00 via INTRAVENOUS

## 2017-12-28 MED ORDER — MIDAZOLAM HCL 5 MG/5ML IJ SOLN
INTRAMUSCULAR | Status: DC | PRN
  Administered 2017-12-28: 2 mg via INTRAVENOUS
  Administered 2017-12-28: 1 mg via INTRAVENOUS
  Administered 2017-12-28: 3 mg via INTRAVENOUS
  Administered 2017-12-28 (×2): 1 mg via INTRAVENOUS

## 2017-12-28 NOTE — Op Note (Signed)
[facility] Patient Name: [name] Procedure Date: 12/28/2017 7:59 AM MRN: [id number] Date of Birth: 08/14/1955 Attending MD: Lionel DecemberNajeeb Anayla Giannetti , MD CSN: [id number] Age: 62 Admit Type: Outpatient Procedure:                Colonoscopy Indications:              Follow-up of diverticulitis Providers:                Lionel DecemberNajeeb Dell Briner, MD, Criselda PeachesLurae B. Patsy LagerAlbert RN, RN, Dyann Ruddleonya                            Wilson Referring MD:             Catalina PizzaZach Hall, MD Medicines:                Meperidine 100 mg IV, Midazolam 8 mg IV Complications:            No immediate complications. Estimated Blood Loss:     Estimated blood loss: none. Procedure:                Pre-Anesthesia Assessment:                           - Prior to the procedure, a History and Physical                            was performed, and patient medications and                            allergies were reviewed. The patient's tolerance of                            previous anesthesia was also reviewed. The risks                            and benefits of the procedure and the sedation                            options and risks were discussed with the patient.                            All questions were answered, and informed consent                            was obtained. Prior Anticoagulants: The patient                            last took aspirin 7 days prior to the procedure.                            ASA Grade Assessment: II - A patient with mild                            systemic disease. After reviewing the risks and  benefits, the patient was deemed in satisfactory                            condition to undergo the procedure.                           After obtaining informed consent, the colonoscope                            was passed under direct vision. Throughout the                            procedure, the patient's blood pressure, pulse, and                            oxygen saturations were  monitored continuously. The                            [id number]-[id number] ([id number]) scope was introduced through                            the anus and advanced to the the cecum, identified                            by appendiceal orifice and ileocecal valve.                            guideThe colonoscopy was technically difficult and                            complex due to restricted mobility of sigmoid                            colon. Successful completion of the procedure was                            aided by increasing the dose of sedation                            medication, changing the patient to a supine                            position, using manual pressure and withdrawing and                            reinserting the scope and using scope guide. The                            quality of the bowel preparation was excellent. The                            ileocecal valve, appendiceal orifice, and rectum  were photographed. Scope In: 8:37:59 AM Scope Out: 9:03:42 AM Scope Withdrawal Time: 0 hours 5 minutes 18 seconds  Total Procedure Duration: 0 hours 25 minutes 43 seconds  Findings:      The perianal and digital rectal examinations were normal.      Multiple small and large-mouthed diverticula were found in the sigmoid       colon.      The exam was otherwise normal throughout the examined colon.      The retroflexed view of the distal rectum and anal verge was normal and       showed no anal or rectal abnormalities. Impression:               - Diverticulosis in the sigmoid colon.                           - No specimens collected. Moderate Sedation:      Moderate (conscious) sedation was administered by the endoscopy nurse       and supervised by the endoscopist. The following parameters were       monitored: oxygen saturation, heart rate, blood pressure, CO2       capnography and response to care. Total physician intraservice time was        31 minutes. Recommendation:           - Patient has a contact number available for                            emergencies. The signs and symptoms of potential                            delayed complications were discussed with the                            patient. Return to normal activities tomorrow.                            Written discharge instructions were provided to the                            patient.                           - High fiber diet today.                           - Continue present medications.                           - Benfiber 4 g po qhs.                           - Repeat colonoscopy in 5 years for screening                            purposes. Procedure Code(s):        --- Professional ---  60454, Colonoscopy, flexible; diagnostic, including                            collection of specimen(s) by brushing or washing,                            when performed (separate procedure)                           G0500, Moderate sedation services provided by the                            same physician or other qualified health care                            professional performing a gastrointestinal                            endoscopic service that sedation supports,                            requiring the presence of an independent trained                            observer to assist in the monitoring of the                            patient's level of consciousness and physiological                            status; initial 15 minutes of intra-service time;                            patient age 40 years or older (additional time may                            be reported with 09811, as appropriate)                           [phone], Moderate sedation services provided by the                            same physician or other qualified health care                            professional performing the diagnostic or                             therapeutic service that the sedation supports,                            requiring the presence of an independent trained                            observer  to assist in the monitoring of the                            patient's level of consciousness and physiological                            status; each additional 15 minutes intraservice                            time (List separately in addition to code for                            primary service) Diagnosis Code(s):        --- Professional ---                           Z61.09, Diverticulitis of large intestine without                            perforation or abscess without bleeding                           K57.30, Diverticulosis of large intestine without                            perforation or abscess without bleeding CPT copyright 2017 [employer]. All rights reserved. The codes documented in this report are preliminary and upon coder review may  be revised to meet current compliance requirements. Lionel December, MD Lionel December, MD 12/28/2017 9:12:33 AM This report has been signed electronically. Number of Addenda: 0

## 2017-12-28 NOTE — Discharge Instructions (Signed)
Resume usual medications as before. Colace 200 mg by mouth daily at bedtime. Benefiber 4 g p.o. Nightly. May use Fiber Chewies or Gummies. May start with 2g and increase to 4g. No driving for 24 hours. Office visit in 6 months.  July 03, 2018 @9 :30AM   Colonoscopy, Adult, Care After This sheet gives you information about how to care for yourself after your procedure. Your health care provider may also give you more specific instructions. If you have problems or questions, contact your health care provider. What can I expect after the procedure? After the procedure, it is common to have:  A small amount of blood in your stool for 24 hours after the procedure.  Some gas.  Mild abdominal cramping or bloating.  Follow these instructions at home: General instructions   For the first 24 hours after the procedure: ? Do not drive or use machinery. ? Do not sign important documents. ? Do not drink alcohol. ? Do your regular daily activities at a slower pace than normal. ? Eat soft, easy-to-digest foods. ? Rest often.  Take over-the-counter or prescription medicines only as told by your health care provider.  It is up to you to get the results of your procedure. Ask your health care provider, or the department performing the procedure, when your results will be ready. Relieving cramping and bloating  Try walking around when you have cramps or feel bloated.  Apply heat to your abdomen as told by your health care provider. Use a heat source that your health care provider recommends, such as a moist heat pack or a heating pad. ? Place a towel between your skin and the heat source. ? Leave the heat on for 20-30 minutes. ? Remove the heat if your skin turns bright red. This is especially important if you are unable to feel pain, heat, or cold. You may have a greater risk of getting burned. Eating and drinking  Drink enough fluid to keep your urine clear or pale yellow.  Resume your  normal diet as instructed by your health care provider. Avoid heavy or fried foods that are hard to digest.  Avoid drinking alcohol for as long as instructed by your health care provider. Contact a health care provider if:  You have blood in your stool 2-3 days after the procedure. Get help right away if:  You have more than a small spotting of blood in your stool.  You pass large blood clots in your stool.  Your abdomen is swollen.  You have nausea or vomiting.  You have a fever.  You have increasing abdominal pain that is not relieved with medicine. This information is not intended to replace advice given to you by your health care provider. Make sure you discuss any questions you have with your health care provider.   Diverticulosis Diverticulosis is a condition that develops when small pouches (diverticula) form in the wall of the large intestine (colon). The colon is where water is absorbed and stool is formed. The pouches form when the inside layer of the colon pushes through weak spots in the outer layers of the colon. You may have a few pouches or many of them. What are the causes? The cause of this condition is not known. What increases the risk? The following factors may make you more likely to develop this condition:  Being older than age 31. Your risk for this condition increases with age. Diverticulosis is rare among people younger than age 68. By age 76,  many people have it.  Eating a low-fiber diet.  Having frequent constipation.  Being overweight.  Not getting enough exercise.  Smoking.  Taking over-the-counter pain medicines, like aspirin and ibuprofen.  Having a family history of diverticulosis.  What are the signs or symptoms? In most people, there are no symptoms of this condition. If you do have symptoms, they may include:  Bloating.  Cramps in the abdomen.  Constipation or diarrhea.  Pain in the lower left side of the abdomen.  How is this  diagnosed? This condition is most often diagnosed during an exam for other colon problems. Because diverticulosis usually has no symptoms, it often cannot be diagnosed independently. This condition may be diagnosed by:  Using a flexible scope to examine the colon (colonoscopy).  Taking an X-ray of the colon after dye has been put into the colon (barium enema).  Doing a CT scan.  How is this treated? You may not need treatment for this condition if you have never developed an infection related to diverticulosis. If you have had an infection before, treatment may include:  Eating a high-fiber diet. This may include eating more fruits, vegetables, and grains.  Taking a fiber supplement.  Taking a live bacteria supplement (probiotic).  Taking medicine to relax your colon.  Taking antibiotic medicines.  Follow these instructions at home:  Drink 6-8 glasses of water or more each day to prevent constipation.  Try not to strain when you have a bowel movement.  If you have had an infection before: ? Eat more fiber as directed by your health care provider or your diet and nutrition specialist (dietitian). ? Take a fiber supplement or probiotic, if your health care provider approves.  Take over-the-counter and prescription medicines only as told by your health care provider.  If you were prescribed an antibiotic, take it as told by your health care provider. Do not stop taking the antibiotic even if you start to feel better.  Keep all follow-up visits as told by your health care provider. This is important. Contact a health care provider if:  You have pain in your abdomen.  You have bloating.  You have cramps.  You have not had a bowel movement in 3 days. Get help right away if:  Your pain gets worse.  Your bloating becomes very bad.  You have a fever or chills, and your symptoms suddenly get worse.  You vomit.  You have bowel movements that are bloody or black.  You  have bleeding from your rectum. Summary  Diverticulosis is a condition that develops when small pouches (diverticula) form in the wall of the large intestine (colon).  You may have a few pouches or many of them.  This condition is most often diagnosed during an exam for other colon problems.  If you have had an infection related to diverticulosis, treatment may include increasing the fiber in your diet, taking supplements, or taking medicines. This information is not intended to replace advice given to you by your health care provider. Make sure you discuss any questions you have with your health care provider.

## 2017-12-28 NOTE — H&P (Signed)
[name] is an 62 y.o. female.   Chief Complaint: Patient is here for colonoscopy. HPI: Patient is 62 year old Caucasian female who was recently treated for diverticulitis.  CT revealed typical changes less prominent left nodes felt to be reactive.  She is now pain-free.  She has chronic constipation and uses Colace.  She denies rectal bleeding.  Last colonoscopy was June 2016. Family history significant for CRC and father who was in his 66s at the time of diagnosis and lived to be in his 54s.  All 6 of his siblings had colon cancer.  Testing for Lynch syndrome negative.  Past Medical History:  Diagnosis Date  . Diabetes mellitus without complication (HCC)   . Diverticulitis   . Hypertension   . Hypothyroidism   . Thyroid disease     Past Surgical History:  Procedure Laterality Date  . ABDOMINAL HYSTERECTOMY    . APPENDECTOMY    . CHOLECYSTECTOMY    . COLONOSCOPY N/A 11/27/2014   Procedure: COLONOSCOPY;  Surgeon: Malissa Hippo, MD;  Location: [facility];  Service: Endoscopy;  Laterality: N/A;  220 - moved to 1:55 - Ann to notify pt  . COLONOSCOPY W/ POLYPECTOMY     2014 Dr. Fonda Kinder    History reviewed. No pertinent family history. Social History:  reports that she quit smoking about 12 years ago. Her smoking use included cigarettes. She has a 15.00 pack-year smoking history. She has never used smokeless tobacco. She reports that she does not drink alcohol or use drugs.  Allergies:  Allergies  Allergen Reactions  . Morphine And Related Nausea And Vomiting    Medications Prior to Admission  Medication Sig Dispense Refill  . aspirin EC 81 MG tablet Take 81 mg by mouth daily.    Marland Kitchen docusate sodium (COLACE) 100 MG capsule Take 100 mg by mouth daily.     Marland Kitchen levothyroxine (SYNTHROID, LEVOTHROID) 150 MCG tablet Take 150 mcg by mouth daily before breakfast.   1  . lisinopril (PRINIVIL,ZESTRIL) 5 MG tablet Take 5 mg by mouth daily.    . Lutein 20 MG TABS Take 20 mg by mouth  daily.    . Melatonin 10 MG TBCR Take 10 mg by mouth at bedtime as needed (for sleep).     . metFORMIN (GLUCOPHAGE) 500 MG tablet Take 500 mg by mouth daily with breakfast.     . pravastatin (PRAVACHOL) 40 MG tablet Take 40 mg by mouth at bedtime.    . nitroGLYCERIN (NITROSTAT) 0.4 MG SL tablet Place 0.4 mg under the tongue every 5 (five) minutes as needed for chest pain.   1    No results found for this or any previous visit (from the past 48 hour(s)). No results found.  ROS  Blood pressure 137/86, pulse 90, temperature 98 F (36.7 C), temperature source Oral, resp. rate 15, SpO2 97 %. Physical Exam  Constitutional: She appears well-developed and well-nourished.  HENT:  Mouth/Throat: Oropharynx is clear and moist.  Eyes: Conjunctivae are normal. No scleral icterus.  Neck: No thyromegaly present.  Cardiovascular: Normal rate, regular rhythm and normal heart sounds.  No murmur heard. Respiratory: Effort normal and breath sounds normal.  GI:  Abdomen is full.  It is soft and nontender with organomegaly or masses.  Appendectomy scar noted in right lower quadrant.  Musculoskeletal: She exhibits no edema.  Lymphadenopathy:    She has no cervical adenopathy.  Neurological: She is alert.  Skin: Skin is warm and dry.  Assessment/Plan History of diverticulitis. Family history of CRC in first-degree relative and multiple second-degree relatives. Diagnostic colonoscopy.  Lionel DecemberNajeeb Rehman, MD 12/28/2017, 8:25 AM

## 2018-01-02 ENCOUNTER — Encounter (HOSPITAL_COMMUNITY): Payer: Self-pay | Admitting: Internal Medicine

## 2018-01-04 ENCOUNTER — Ambulatory Visit (HOSPITAL_COMMUNITY)
Admission: RE | Admit: 2018-01-04 | Discharge: 2018-01-04 | Disposition: A | Discharge status: Home / Self Care | Payer: 59 | Source: Ambulatory Visit | Attending: Internal Medicine | Admitting: Internal Medicine

## 2018-01-04 DIAGNOSIS — Z1231 Encounter for screening mammogram for malignant neoplasm of breast: Secondary | ICD-10-CM | POA: Insufficient documentation

## 2018-06-01 DIAGNOSIS — R944 Abnormal results of kidney function studies: Secondary | ICD-10-CM | POA: Diagnosis not present

## 2018-06-01 DIAGNOSIS — I10 Essential (primary) hypertension: Secondary | ICD-10-CM | POA: Diagnosis not present

## 2018-06-01 DIAGNOSIS — E1169 Type 2 diabetes mellitus with other specified complication: Secondary | ICD-10-CM | POA: Diagnosis not present

## 2018-06-01 DIAGNOSIS — E039 Hypothyroidism, unspecified: Secondary | ICD-10-CM | POA: Diagnosis not present

## 2018-06-01 DIAGNOSIS — E1165 Type 2 diabetes mellitus with hyperglycemia: Secondary | ICD-10-CM | POA: Diagnosis not present

## 2018-06-05 DIAGNOSIS — E1122 Type 2 diabetes mellitus with diabetic chronic kidney disease: Secondary | ICD-10-CM | POA: Diagnosis not present

## 2018-06-05 DIAGNOSIS — N182 Chronic kidney disease, stage 2 (mild): Secondary | ICD-10-CM | POA: Diagnosis not present

## 2018-06-05 DIAGNOSIS — I10 Essential (primary) hypertension: Secondary | ICD-10-CM | POA: Diagnosis not present

## 2018-07-03 ENCOUNTER — Ambulatory Visit (INDEPENDENT_AMBULATORY_CARE_PROVIDER_SITE_OTHER): Payer: 59 | Admitting: Internal Medicine

## 2018-07-03 ENCOUNTER — Encounter (INDEPENDENT_AMBULATORY_CARE_PROVIDER_SITE_OTHER): Payer: Self-pay | Admitting: Internal Medicine

## 2018-07-03 VITALS — BP 140/83 | HR 70 | Temp 98.2°F | Resp 18 | Ht 64.0 in | Wt 213.3 lb

## 2018-07-03 DIAGNOSIS — Z8719 Personal history of other diseases of the digestive system: Secondary | ICD-10-CM

## 2018-07-03 DIAGNOSIS — K5900 Constipation, unspecified: Secondary | ICD-10-CM | POA: Diagnosis not present

## 2018-07-03 DIAGNOSIS — R1030 Lower abdominal pain, unspecified: Secondary | ICD-10-CM

## 2018-07-03 MED ORDER — DICYCLOMINE HCL 10 MG PO CAPS
10.0000 mg | ORAL_CAPSULE | Freq: Three times a day (TID) | ORAL | 1 refills | Status: DC | PRN
Start: 2018-07-03 — End: 2020-07-09

## 2018-07-03 NOTE — Patient Instructions (Addendum)
Call with progress report in 6 to 8 weeks to let us know if dicyclomine is helping. High-fiber diet as tolerated.

## 2018-07-03 NOTE — Progress Notes (Signed)
Presenting complaint;  Lower abdominal pain.  History of diverticulitis and constipation.  Database and subjective:  Patient is 63 year old Caucasian female who has history of sigmoid diverticulitis and was last treated in May 2019.  She also has very significant family history of colon carcinoma.  Her father had colon carcinoma in his 240s and lived to be in his 4670s and all 6 of her father's siblings had colon carcinoma. She underwent colonoscopy in July 2019 revealing sigmoid colon diverticulosis but no polyps. She now returns for scheduled visit.  She continues to complain of lower abdominal pain.  She describes this pain to be nagging mild pain which does not appear to have any relationship with her bowels.  She describes this pain to be different pain that she has experienced for diverticulitis.  This pain is intermittent and not every day.  She says she is doing better as far as her constipation is concerned.  She takes 2 tablets of Colace daily and 2 Fiberchoice pills.  She is having normal stools on most days.  Every now and then she may have diarrhea and when she does she may have 4-5 loose stools.  She denies melena or rectal bleeding.  Her appetite is good and her weight has been stable.   Current Medications: Outpatient Encounter Medications as of 07/03/2018  Medication Sig  . docusate sodium (COLACE) 100 MG capsule Take 1 capsule (100 mg total) by mouth daily. (Patient taking differently: Take 100 mg by mouth 2 (two) times daily. Patient takes at night.)  . Inulin (FIBERCHOICE) 2 g CHEW Chew by mouth daily.  Marland Kitchen. levothyroxine (SYNTHROID, LEVOTHROID) 125 MCG tablet Take 125 mcg by mouth daily before breakfast.   . lisinopril (PRINIVIL,ZESTRIL) 5 MG tablet Take 5 mg by mouth daily.  . Lutein 20 MG TABS Take 25 mg by mouth daily.   . Melatonin 10 MG TBCR Take 10 mg by mouth at bedtime as needed (for sleep).   . pravastatin (PRAVACHOL) 40 MG tablet Take 40 mg by mouth at bedtime.  Marland Kitchen.  SYNJARDY 5-500 MG TABS Take 1 tablet by mouth 2 (two) times daily.  . [DISCONTINUED] aspirin EC 81 MG tablet Take 81 mg by mouth daily.  . [DISCONTINUED] metFORMIN (GLUCOPHAGE) 500 MG tablet Take 500 mg by mouth daily with breakfast.   . [DISCONTINUED] nitroGLYCERIN (NITROSTAT) 0.4 MG SL tablet Place 0.4 mg under the tongue every 5 (five) minutes as needed for chest pain.   . [DISCONTINUED] Wheat Dextrin (BENEFIBER DRINK MIX) PACK Take 4 g by mouth at bedtime. (Patient not taking: Reported on 07/03/2018)   No facility-administered encounter medications on file as of 07/03/2018.      Objective: Blood pressure 140/83, pulse 70, temperature 98.2 F (36.8 C), temperature source Oral, resp. rate 18, height 5\' 4"  (1.626 m), weight 213 lb 4.8 oz (96.8 kg). Patient is alert and in no acute distress. Conjunctiva is pink. Sclera is nonicteric Oropharyngeal mucosa is normal. No neck masses or thyromegaly noted. Cardiac exam with regular rhythm normal S1 and S2. No murmur or gallop noted. Lungs are clear to auscultation. Abdomen is full.  Bowel sounds are normal.  On palpation abdomen is soft and nontender with organomegaly or masses. No LE edema or clubbing noted.  Assessment:  #1.  History of sigmoid diverticulitis.  She was treated several months ago.  She needs to be treated quickly should she have another episode in which case she will need to contact her office immediately.  #2.  Chronic  constipation.  She is doing well with dietary measures and stool softener.  If these measures fail next that would be polyethylene glycol but will monitor for now.  #3.  Lower abdominal pain described to be nagging pain.  It remains to be seen if she has underlying IBS.  Her abdominal exam today is unremarkable.   Plan:  Continue high-fiber diet as before. Dicyclomine 10 mg p.o. 3 times daily PRN.  Patient informed of potential side effects such as constipation and dry mouth.  Since she is going to be  taking medicine on as needed visit  may not experience any side effects. Patient will call us with progress report in 4 to 6 weeks. Office visit in 1 year.

## 2018-07-16 DIAGNOSIS — E1165 Type 2 diabetes mellitus with hyperglycemia: Secondary | ICD-10-CM | POA: Diagnosis not present

## 2018-07-16 DIAGNOSIS — E039 Hypothyroidism, unspecified: Secondary | ICD-10-CM | POA: Diagnosis not present

## 2018-07-16 DIAGNOSIS — R5383 Other fatigue: Secondary | ICD-10-CM | POA: Diagnosis not present

## 2018-10-02 DIAGNOSIS — K219 Gastro-esophageal reflux disease without esophagitis: Secondary | ICD-10-CM | POA: Diagnosis not present

## 2018-10-02 DIAGNOSIS — R05 Cough: Secondary | ICD-10-CM | POA: Diagnosis not present

## 2018-11-28 ENCOUNTER — Other Ambulatory Visit (HOSPITAL_COMMUNITY): Payer: Self-pay | Admitting: Internal Medicine

## 2018-11-28 DIAGNOSIS — Z1231 Encounter for screening mammogram for malignant neoplasm of breast: Secondary | ICD-10-CM

## 2019-01-07 ENCOUNTER — Ambulatory Visit (HOSPITAL_COMMUNITY)
Admission: RE | Admit: 2019-01-07 | Discharge: 2019-01-07 | Disposition: A | Discharge status: Home / Self Care | Payer: 59 | Source: Ambulatory Visit | Attending: Internal Medicine | Admitting: Internal Medicine

## 2019-01-07 ENCOUNTER — Other Ambulatory Visit: Payer: Self-pay

## 2019-01-07 DIAGNOSIS — Z1231 Encounter for screening mammogram for malignant neoplasm of breast: Secondary | ICD-10-CM | POA: Diagnosis not present

## 2019-07-02 ENCOUNTER — Ambulatory Visit (INDEPENDENT_AMBULATORY_CARE_PROVIDER_SITE_OTHER): Payer: 59 | Admitting: Internal Medicine

## 2019-10-15 ENCOUNTER — Telehealth (INDEPENDENT_AMBULATORY_CARE_PROVIDER_SITE_OTHER): Payer: Self-pay | Admitting: Internal Medicine

## 2019-10-15 ENCOUNTER — Encounter (INDEPENDENT_AMBULATORY_CARE_PROVIDER_SITE_OTHER): Payer: Self-pay | Admitting: *Deleted

## 2019-10-15 NOTE — Telephone Encounter (Signed)
Patient left message stating she needs Dr Karilyn Cota to send in a script for Cipro instead of Flagyl - send to [facility] pharmacy

## 2019-10-15 NOTE — Telephone Encounter (Signed)
Per Dr.Rehman may call in Cipro 500 mg - Take 1 by mouth twice a day for 10 days.May also call in the Flagyl 500 mg 1 my mouth twice a day for 10 days. Patient states that the Flagyl makes her deathly sick. This was not called in. Cipro was called to the [facility] and left on Pharmacist voicemail. If patient is no better in 2 days she is to call our office  , she was made aware.  Otherwise , Dr.Rehman ask that the patient be seen week after next on [facility] schedule. Patient says that her mother has had a stroke and she has to stay with her while her bother works,so this visit may need to be virtual.

## 2019-10-18 ENCOUNTER — Telehealth (INDEPENDENT_AMBULATORY_CARE_PROVIDER_SITE_OTHER): Payer: Self-pay | Admitting: Internal Medicine

## 2019-10-18 NOTE — Telephone Encounter (Signed)
Patient left progress report on voice mail - stated she is taking Cipro - is still hurting but is getting better

## 2019-12-02 ENCOUNTER — Other Ambulatory Visit (HOSPITAL_COMMUNITY): Payer: Self-pay | Admitting: Adult Health Nurse Practitioner

## 2019-12-02 DIAGNOSIS — Z1231 Encounter for screening mammogram for malignant neoplasm of breast: Secondary | ICD-10-CM

## 2019-12-13 ENCOUNTER — Other Ambulatory Visit (HOSPITAL_COMMUNITY): Payer: Self-pay | Admitting: Internal Medicine

## 2019-12-13 ENCOUNTER — Other Ambulatory Visit: Payer: Self-pay | Admitting: Internal Medicine

## 2019-12-13 DIAGNOSIS — E039 Hypothyroidism, unspecified: Secondary | ICD-10-CM

## 2019-12-27 ENCOUNTER — Other Ambulatory Visit: Payer: Self-pay

## 2019-12-27 ENCOUNTER — Ambulatory Visit (HOSPITAL_COMMUNITY)
Admission: RE | Admit: 2019-12-27 | Discharge: 2019-12-27 | Disposition: A | Discharge status: Home / Self Care | Payer: 59 | Source: Ambulatory Visit | Attending: Internal Medicine | Admitting: Internal Medicine

## 2019-12-27 DIAGNOSIS — E039 Hypothyroidism, unspecified: Secondary | ICD-10-CM | POA: Diagnosis present

## 2020-01-10 ENCOUNTER — Other Ambulatory Visit: Payer: Self-pay

## 2020-01-10 ENCOUNTER — Ambulatory Visit (HOSPITAL_COMMUNITY)
Admission: RE | Admit: 2020-01-10 | Discharge: 2020-01-10 | Disposition: A | Discharge status: Home / Self Care | Payer: 59 | Source: Ambulatory Visit | Attending: Adult Health Nurse Practitioner | Admitting: Adult Health Nurse Practitioner

## 2020-01-10 DIAGNOSIS — Z1231 Encounter for screening mammogram for malignant neoplasm of breast: Secondary | ICD-10-CM | POA: Diagnosis not present

## 2020-03-04 ENCOUNTER — Ambulatory Visit (INDEPENDENT_AMBULATORY_CARE_PROVIDER_SITE_OTHER): Payer: 59 | Admitting: Internal Medicine

## 2020-03-04 ENCOUNTER — Other Ambulatory Visit: Payer: Self-pay

## 2020-03-04 ENCOUNTER — Encounter: Payer: Self-pay | Admitting: Internal Medicine

## 2020-03-04 VITALS — BP 108/72 | HR 77 | Ht 64.0 in | Wt 169.4 lb

## 2020-03-04 DIAGNOSIS — E063 Autoimmune thyroiditis: Secondary | ICD-10-CM | POA: Diagnosis not present

## 2020-03-04 NOTE — Progress Notes (Signed)
Name: [name]  MRN/ DOB: [id number], April 12, 1956    Age/ Sex: 64 y.o., female    PCP: Benita Stabile, MD   Reason for Endocrinology Evaluation: Elevated Anti-TPO antibody     Date of Initial Endocrinology Evaluation: 03/04/2020     HPI: [name] is a 64 y.o. female with a past medical history of HTN, dyslipidemia  And T2DM. The patient presented for initial endocrinology clinic visit on 03/04/2020 for consultative assistance with her elevated anti-TPO antibody   Has been diagnosed with hypothyroidism years ago and has been on L T4 replacement ever since.  She has been on levothyroxine up to 150 MCG daily, but recently and due to low TSH her dose has been gradually reduced and she is currently on U thyroid 88 MCG daily   She has intentional weight loss from  223 to 160 lbs since the beginning of the year.  Fatigue is stable  Has occasional constipation  Denies depression and anxiety   No biotin use   No FH of thyroid disease     HISTORY:  Past Medical History:  Past Medical History:  Diagnosis Date  . Diabetes mellitus without complication (HCC)   . Diverticulitis   . Hypertension   . Hypothyroidism   . Thyroid disease    Past Surgical History:  Past Surgical History:  Procedure Laterality Date  . ABDOMINAL HYSTERECTOMY    . APPENDECTOMY    . CHOLECYSTECTOMY    . COLONOSCOPY N/A 11/27/2014   Procedure: COLONOSCOPY;  Surgeon: Malissa Hippo, MD;  [facility];  Service: Endoscopy;  Laterality: N/A;  220 - moved to 1:55 - Ann to notify pt  . COLONOSCOPY N/A 12/28/2017   Procedure: COLONOSCOPY;  Surgeon: Malissa Hippo, MD;  Location: [facility];  Service: Endoscopy;  Laterality: N/A;  10:30  . COLONOSCOPY W/ POLYPECTOMY     2014 Dr. Fonda Kinder      Social History:  reports that she quit smoking about 14 years ago. Her smoking use included cigarettes. She has a 15.00 pack-year smoking history. She has never used smokeless  tobacco. She reports that she does not drink alcohol and does not use drugs.  Family History: family history is not on file.   HOME MEDICATIONS: Allergies as of 03/04/2020      Reactions   Flagyl [metronidazole] Other (See Comments)   Patient states that this medication makes her deathly sick.   Morphine And Related Nausea And Vomiting      Medication List       Accurate as of March 04, 2020  9:24 AM. If you have any questions, ask your nurse or doctor.        STOP taking these medications   Synjardy 5-500 MG Tabs Generic drug: Empagliflozin-metFORMIN HCl Stopped by: Scarlette Shorts, MD     TAKE these medications   dicyclomine 10 MG capsule Commonly known as: BENTYL Take 1 capsule (10 mg total) by mouth 3 (three) times daily as needed.   docusate sodium 100 MG capsule Commonly known as: COLACE Take 1 capsule (100 mg total) by mouth daily. What changed:   when to take this  additional instructions   Euthyrox 88 MCG tablet Generic drug: levothyroxine Take 88 mcg by mouth every morning. What changed: Another medication with the same name was removed. Continue taking this medication, and follow the directions you see here. Changed by: Scarlette Shorts, MD   FiberChoice 2 g  Chew Generic drug: Inulin Chew by mouth daily.   lisinopril 5 MG tablet Commonly known as: ZESTRIL Take 5 mg by mouth daily.   Lutein 20 MG Tabs Take 25 mg by mouth daily.   Melatonin 10 MG Tbcr Take 10 mg by mouth at bedtime as needed (for sleep).   pravastatin 40 MG tablet Commonly known as: PRAVACHOL Take 40 mg by mouth at bedtime.         REVIEW OF SYSTEMS: A comprehensive ROS was conducted with the patient and is negative except as per HPI and below:     OBJECTIVE:  VS: BP 108/72 (BP Location: Left Arm, Patient Position: Sitting, Cuff Size: Small)   Pulse 77   Ht 5\' 4"  (1.626 m)   Wt 169 lb 6.4 oz (76.8 kg)   SpO2 99%   BMI 29.08 kg/m    Wt Readings from  Last 3 Encounters:  03/04/20 169 lb 6.4 oz (76.8 kg)  07/03/18 213 lb 4.8 oz (96.8 kg)  10/10/17 202 lb 6.4 oz (91.8 kg)     EXAM: General: Pt appears well and is in NAD  Hydration: Well-hydrated with moist mucous membranes and good skin turgor  Eyes: External eye exam normal with a stare, but without lid lag or exophthalmos.  EOM intact.  PERRL.  Neck: General: Supple without adenopathy. Thyroid: Thyroid size normal.  No goiter or nodules appreciated. No thyroid bruit.  Lungs: Clear with good BS bilat with no rales, rhonchi, or wheezes  Heart: Auscultation: RRR.  Abdomen: Normoactive bowel sounds, soft, nontender, without masses or organomegaly palpable  Extremities:  BL LE: No pretibial edema normal ROM and strength.  Skin: Hair: Texture and amount normal with gender appropriate distribution Skin Inspection: No rashes Skin Palpation: Skin temperature, texture, and thickness normal to palpation  Neuro: Cranial nerves: II - XII grossly intact  Motor: Normal strength throughout DTRs: 2+ and symmetric in UE without delay in relaxation phase  Mental Status: Judgment, insight: Intact Orientation: Oriented to time, place, and person Mood and affect: No depression, anxiety, or agitation     DATA REVIEWED: 02/11/2020 TPO Ab's 77 iu/mL (0-0.34)    ASSESSMENT/PLAN/RECOMMENDATIONS:   1. Hashimoto's thyroiditis :  -Patient is clinically euthyroid - No local neck symptoms - Pt educated extensively on the correct way to take levothyroxine (first thing in the morning with water, 30 minutes before eating or taking other medications). - Pt encouraged to double dose the following day if she were to miss a dose given long half-life of levothyroxine. -She is concerned that she may have Graves' disease, at this point in time I cannot rule this in.  Part of her need to have L T4 reduced could be the weight loss. -At this point I would recommend repeating TFTs the week of March 31, 2019 at PCPs  office as it is too soon to check today   Medications : Continue your thyroid 71 MCG daily   Follow-up 1st week of December  Signed electronically by: January, MD  [facility]  [facility] [address] [place] [place], [place] Phone: [phone] FAX: [phone]   CC: [phone], MD [address] [place] Phone: [phone] Fax: [phone]   Return to Endocrinology clinic as below: Future Appointments  Date Time Provider Department Center  05/14/2020  9:30 AM Adasha Boehme, 14/02/2020, MD [facility] None

## 2020-03-04 NOTE — Patient Instructions (Addendum)
You are on levothyroxine - which is your thyroid hormone supplement. You MUST take this consistently.  You should take this first thing in the morning on an empty stomach with water. You should not take it with other medications. Wait to 1hr prior to eating. If you are taking any vitamins - please take these in the evening.   If you miss a dose, please take your missed dose the following day (double the dose for that day). You should have a pill box for ONLY levothyroxine on your bedside table to help you remember to take your medications.    - Please have your labs repeat the week of October 25th

## 2020-04-07 ENCOUNTER — Telehealth: Payer: Self-pay | Admitting: Internal Medicine

## 2020-04-07 NOTE — Telephone Encounter (Signed)
Can we get the thyroid results Please ?

## 2020-04-07 NOTE — Telephone Encounter (Signed)
Patient requests to be called at ph# [phone]  Re:Per Patient-Patient's saw NP who  told patient to get in touch with Dr. Lonzo Cloud due to patient's recent labs showed patient's thyroid levels are still off

## 2020-04-07 NOTE — Telephone Encounter (Signed)
Please see below.

## 2020-04-08 NOTE — Telephone Encounter (Signed)
Requested patient to get results sent to the office.

## 2020-04-28 ENCOUNTER — Telehealth: Payer: Self-pay | Admitting: Internal Medicine

## 2020-04-28 NOTE — Telephone Encounter (Signed)
FYI, Patient called to cancel appointment and stated "Patient stated she does not need to see an Endocrinologist anymore"

## 2020-04-28 NOTE — Telephone Encounter (Signed)
Noted  

## 2020-05-14 ENCOUNTER — Ambulatory Visit: Payer: 59 | Admitting: Internal Medicine

## 2020-07-01 ENCOUNTER — Other Ambulatory Visit: Payer: Self-pay

## 2020-07-01 ENCOUNTER — Other Ambulatory Visit (INDEPENDENT_AMBULATORY_CARE_PROVIDER_SITE_OTHER): Payer: Self-pay | Admitting: Internal Medicine

## 2020-07-01 ENCOUNTER — Telehealth (INDEPENDENT_AMBULATORY_CARE_PROVIDER_SITE_OTHER): Payer: Self-pay | Admitting: Internal Medicine

## 2020-07-01 ENCOUNTER — Encounter (HOSPITAL_COMMUNITY): Payer: Self-pay | Admitting: Emergency Medicine

## 2020-07-01 ENCOUNTER — Emergency Department (HOSPITAL_COMMUNITY): Payer: 59

## 2020-07-01 ENCOUNTER — Emergency Department (HOSPITAL_COMMUNITY)
Admission: EM | Admit: 2020-07-01 | Discharge: 2020-07-02 | Disposition: A | Discharge status: Home / Self Care | Payer: 59 | Attending: Emergency Medicine | Admitting: Emergency Medicine

## 2020-07-01 DIAGNOSIS — K5792 Diverticulitis of intestine, part unspecified, without perforation or abscess without bleeding: Secondary | ICD-10-CM

## 2020-07-01 DIAGNOSIS — K5732 Diverticulitis of large intestine without perforation or abscess without bleeding: Secondary | ICD-10-CM | POA: Diagnosis not present

## 2020-07-01 DIAGNOSIS — I10 Essential (primary) hypertension: Secondary | ICD-10-CM | POA: Insufficient documentation

## 2020-07-01 DIAGNOSIS — R112 Nausea with vomiting, unspecified: Secondary | ICD-10-CM | POA: Insufficient documentation

## 2020-07-01 DIAGNOSIS — Z87891 Personal history of nicotine dependence: Secondary | ICD-10-CM | POA: Diagnosis not present

## 2020-07-01 DIAGNOSIS — R1032 Left lower quadrant pain: Secondary | ICD-10-CM | POA: Diagnosis present

## 2020-07-01 DIAGNOSIS — Z79899 Other long term (current) drug therapy: Secondary | ICD-10-CM | POA: Insufficient documentation

## 2020-07-01 DIAGNOSIS — E039 Hypothyroidism, unspecified: Secondary | ICD-10-CM | POA: Diagnosis not present

## 2020-07-01 HISTORY — DX: Autoimmune thyroiditis: E06.3

## 2020-07-01 HISTORY — DX: Hyperlipidemia, unspecified: E78.5

## 2020-07-01 LAB — URINALYSIS, ROUTINE W REFLEX MICROSCOPIC
Bacteria, UA: NONE SEEN
Bilirubin Urine: NEGATIVE
Glucose, UA: NEGATIVE mg/dL
Hgb urine dipstick: NEGATIVE
Ketones, ur: NEGATIVE mg/dL
Nitrite: NEGATIVE
Protein, ur: NEGATIVE mg/dL
Specific Gravity, Urine: >1.046 — ABNORMAL HIGH (ref 1.005–1.030)
pH: 6 (ref 5.0–8.0)

## 2020-07-01 LAB — COMPREHENSIVE METABOLIC PANEL
ALT: 17 U/L (ref 0–44)
AST: 16 U/L (ref 15–41)
Albumin: 4.3 g/dL (ref 3.5–5.0)
Alkaline Phosphatase: 40 U/L (ref 38–126)
Anion gap: 11 (ref 5–15)
BUN: 24 mg/dL — ABNORMAL HIGH (ref 8–23)
CO2: 27 mmol/L (ref 22–32)
Calcium: 9.9 mg/dL (ref 8.9–10.3)
Chloride: 98 mmol/L (ref 98–111)
Creatinine, Ser: 1.02 mg/dL — ABNORMAL HIGH (ref 0.44–1.00)
GFR, Estimated: >60 mL/min (ref >60)
Glucose, Bld: 150 mg/dL — ABNORMAL HIGH (ref 70–99)
Potassium: 4.2 mmol/L (ref 3.5–5.1)
Sodium: 136 mmol/L (ref 135–145)
Total Bilirubin: 1.3 mg/dL — ABNORMAL HIGH (ref 0.3–1.2)
Total Protein: 7.8 g/dL (ref 6.5–8.1)

## 2020-07-01 LAB — CBC
HCT: 43.7 % (ref 36.0–46.0)
Hemoglobin: 14.6 g/dL (ref 12.0–15.0)
MCH: 32.4 pg (ref 26.0–34.0)
MCHC: 33.4 g/dL (ref 30.0–36.0)
MCV: 96.9 fL (ref 80.0–100.0)
Platelets: 188 10*3/uL (ref 150–400)
RBC: 4.51 MIL/uL (ref 3.87–5.11)
RDW: 13.8 % (ref 11.5–15.5)
WBC: 12.5 10*3/uL — ABNORMAL HIGH (ref 4.0–10.5)
nRBC: 0 % (ref 0.0–0.2)

## 2020-07-01 LAB — LIPASE, BLOOD: Lipase: 23 U/L (ref 11–51)

## 2020-07-01 MED ORDER — IOHEXOL 300 MG/ML  SOLN
100.0000 mL | Freq: Once | INTRAMUSCULAR | Status: AC | PRN
  Administered 2020-07-01: 100 mL via INTRAVENOUS

## 2020-07-01 MED ORDER — SODIUM CHLORIDE 0.9 % IV SOLN
3.0000 g | Freq: Once | INTRAVENOUS | Status: AC
  Administered 2020-07-02: 3 g via INTRAVENOUS
  Filled 2020-07-01: qty 8

## 2020-07-01 MED ORDER — SODIUM CHLORIDE 0.9 % IV BOLUS
500.0000 mL | Freq: Once | INTRAVENOUS | Status: AC
  Administered 2020-07-02: 500 mL via INTRAVENOUS

## 2020-07-01 MED ORDER — FENTANYL CITRATE (PF) 100 MCG/2ML IJ SOLN
50.0000 ug | Freq: Once | INTRAMUSCULAR | Status: AC
  Administered 2020-07-02: 50 ug via INTRAVENOUS
  Filled 2020-07-01: qty 2

## 2020-07-01 MED ORDER — ONDANSETRON HCL 4 MG/2ML IJ SOLN
4.0000 mg | Freq: Once | INTRAMUSCULAR | Status: AC
  Administered 2020-07-02: 4 mg via INTRAVENOUS
  Filled 2020-07-01: qty 2

## 2020-07-01 MED ORDER — AMOXICILLIN-POT CLAVULANATE 875-125 MG PO TABS: 1.0000 | ORAL_TABLET | Freq: Two times a day (BID) | ORAL | 0 refills | Status: DC

## 2020-07-01 NOTE — ED Triage Notes (Addendum)
Pt c/o lower abdominal pain x3 day with N/V and constipation.  EMS report CBG of 179.

## 2020-07-01 NOTE — ED Notes (Signed)
Patient ambulated to restroom with standby assistance. Patient was able to void at this time. Patient states that she has been feeling dizzy today.

## 2020-07-01 NOTE — Telephone Encounter (Signed)
I spoke with the patient and she states for the last four days she has had a diverticulitis flare. She states she knows that this is what it is . She says she has been having some lower abdominal pain, no fever, no nausea, no vomiting she does have some constipation and is taking 2 colace QHS. She has not noticed any dark or bloody stools. She would like Cipro sent in to [facility]. Please advise.

## 2020-07-01 NOTE — Telephone Encounter (Signed)
Patient says she had Cipro which she started 2 days ago. Symptoms started 4 days ago.  He is not running fever. She says her nephew age 65 died of complicated diverticulitis because he did not seek medical help soon enough  Patient is allergic to Flagyl Start patient on Augmentin 875 twice daily for 10 days. Also advised to take probiotic daily for at least a month. Advised to call office with progress for next week When she starts feeling better she should start taking Metamucil 4 g daily

## 2020-07-01 NOTE — ED Provider Notes (Signed)
[facility] EMERGENCY DEPARTMENT Provider Note   CSN: [id number] Arrival date & time: 07/01/20  1902     History Chief Complaint  Patient presents with  . Abdominal Pain    [name] is a 65 y.o. female.  Patient presents to the emergency department for evaluation of abdominal pain.  Patient has been experiencing progressively worsening lower abdominal pain for 3 days.  She called Dr. Karilyn Cota and he called in a prescription for Augmentin because she has a history of diverticulitis.  She has not taken the medication yet, because she started vomiting when she was picking it up and came directly to the emergency department.        Past Medical History:  Diagnosis Date  . Diabetes mellitus without complication (HCC)   . Diverticulitis   . Hashimoto's disease   . Hyperlipidemia   . Hypertension   . Hypothyroidism   . Thyroid disease     Patient Active Problem List   Diagnosis Date Noted  . Diverticulitis of colon 10/12/2017  . Diverticulitis 07/27/2015  . Obesity 07/27/2015  . Hypothyroidism 07/27/2015  . Diabetes mellitus type 2, diet-controlled (HCC) 07/27/2015  . Diverticulitis of colon with perforation 07/27/2015  . Leukocytosis 07/27/2015  . Constipation 07/27/2015  . Nausea 07/27/2015  . Abdominal pain 07/27/2015  . Upper airway cough syndrome 05/20/2013    Past Surgical History:  Procedure Laterality Date  . ABDOMINAL HYSTERECTOMY    . APPENDECTOMY    . CHOLECYSTECTOMY    . COLONOSCOPY N/A 11/27/2014   Procedure: COLONOSCOPY;  Surgeon: Malissa Hippo, MD;  [facility];  Service: Endoscopy;  Laterality: N/A;  220 - moved to 1:55 - Ann to notify pt  . COLONOSCOPY N/A 12/28/2017   Procedure: COLONOSCOPY;  Surgeon: Malissa Hippo, MD;  [facility];  Service: Endoscopy;  Laterality: N/A;  10:30  . COLONOSCOPY W/ POLYPECTOMY     2014 Dr. Fonda Kinder     OB History   No obstetric history on file.     History reviewed. No  pertinent family history.  Social History   Tobacco Use  . Smoking status: Former Smoker    Packs/day: 1.00    Years: 15.00    Pack years: 15.00    Types: Cigarettes    Quit date: 06/06/2005    Years since quitting: 15.0  . Smokeless tobacco: Never Used  Vaping Use  . Vaping Use: Never used  Substance Use Topics  . Alcohol use: No  . Drug use: No    Home Medications Prior to Admission medications   Medication Sig Start Date End Date Taking? Authorizing Provider  amoxicillin-clavulanate (AUGMENTIN) 875-125 MG tablet Take 1 tablet by mouth 2 (two) times daily. 07/01/20   Malissa Hippo, MD  dicyclomine (BENTYL) 10 MG capsule Take 1 capsule (10 mg total) by mouth 3 (three) times daily as needed. 07/03/18   Rehman, Joline Maxcy, MD  docusate sodium (COLACE) 100 MG capsule Take 1 capsule (100 mg total) by mouth daily. Patient taking differently: Take 100 mg by mouth 2 (two) times daily. Patient takes at night. 12/28/17   Rehman, Joline Maxcy, MD  EUTHYROX 88 MCG tablet Take 88 mcg by mouth every morning. 02/12/20   [provider]  Inulin (FIBERCHOICE) 2 g CHEW Chew by mouth daily.    [provider]  lisinopril (PRINIVIL,ZESTRIL) 5 MG tablet Take 5 mg by mouth daily.    [provider]  Lutein 20 MG TABS Take  25 mg by mouth daily.     [provider]  Melatonin 10 MG TBCR Take 10 mg by mouth at bedtime as needed (for sleep).     [provider]  pravastatin (PRAVACHOL) 40 MG tablet Take 40 mg by mouth at bedtime.    [provider]    Allergies    Flagyl [metronidazole] and Morphine and related  Review of Systems   Review of Systems  Gastrointestinal: Positive for abdominal pain, nausea and vomiting.  All other systems reviewed and are negative.   Physical Exam Updated Vital Signs BP 137/78 (BP Location: Left Arm)   Pulse 93   Temp 99 F (37.2 C) (Oral)   Resp 20   Ht 5\' 4"  (1.626 m)   Wt 78.5 kg   SpO2 100%   BMI 29.70 kg/m    Physical Exam Vitals and nursing note reviewed.  Constitutional:      General: She is not in acute distress.    Appearance: Normal appearance. She is well-developed and well-nourished.  HENT:     Head: Normocephalic and atraumatic.     Right Ear: Hearing normal.     Left Ear: Hearing normal.     Nose: Nose normal.     Mouth/Throat:     Mouth: Oropharynx is clear and moist and mucous membranes are normal.  Eyes:     Extraocular Movements: EOM normal.     Conjunctiva/sclera: Conjunctivae normal.     Pupils: Pupils are equal, round, and reactive to light.  Cardiovascular:     Rate and Rhythm: Regular rhythm.     Heart sounds: S1 normal and S2 normal. No murmur heard. No friction rub. No gallop.   Pulmonary:     Effort: Pulmonary effort is normal. No respiratory distress.     Breath sounds: Normal breath sounds.  Chest:     Chest wall: No tenderness.  Abdominal:     General: Bowel sounds are normal.     Palpations: Abdomen is soft. There is no hepatosplenomegaly.     Tenderness: There is abdominal tenderness in the left lower quadrant. There is no guarding or rebound. Negative signs include Murphy's sign and McBurney's sign.     Hernia: No hernia is present.  Musculoskeletal:        General: Normal range of motion.     Cervical back: Normal range of motion and neck supple.  Skin:    General: Skin is warm, dry and intact.     Findings: No rash.     Nails: There is no cyanosis.  Neurological:     Mental Status: She is alert and oriented to person, place, and time.     GCS: GCS eye subscore is 4. GCS verbal subscore is 5. GCS motor subscore is 6.     Cranial Nerves: No cranial nerve deficit.     Sensory: No sensory deficit.     Coordination: Coordination normal.     Deep Tendon Reflexes: Strength normal.  Psychiatric:        Mood and Affect: Mood and affect normal.        Speech: Speech normal.        Behavior: Behavior normal.        Thought Content: Thought content  normal.     ED Results / Procedures / Treatments   Labs (all labs ordered are listed, but only abnormal results are displayed) Labs Reviewed  COMPREHENSIVE METABOLIC PANEL - Abnormal; Notable for the following components:  Result Value   Glucose, Bld 150 (*)    BUN 24 (*)    Creatinine, Ser 1.02 (*)    Total Bilirubin 1.3 (*)    All other components within normal limits  CBC - Abnormal; Notable for the following components:   WBC 12.5 (*)    All other components within normal limits  LIPASE, BLOOD  URINALYSIS, ROUTINE W REFLEX MICROSCOPIC    EKG None  Radiology CT ABDOMEN PELVIS W CONTRAST  Result Date: 07/01/2020 CLINICAL DATA:  Abdominal distension, lower abdominal pain EXAM: CT ABDOMEN AND PELVIS WITH CONTRAST TECHNIQUE: Multidetector CT imaging of the abdomen and pelvis was performed using the standard protocol following bolus administration of intravenous contrast. CONTRAST:  OMNIPAQUE IOHEXOL 300 MG/ML  SOLN COMPARISON:  10/11/2017 FINDINGS: Lower chest: Lung bases are clear. No effusions. Heart is normal size. Hepatobiliary: No focal liver abnormality is seen. Status post cholecystectomy. No biliary dilatation. Tiny hypodensities within the right hepatic lobe is stable since prior study most compatible with small cysts. Pancreas: No focal abnormality or ductal dilatation. Spleen: No focal abnormality.  Normal size. Adrenals/Urinary Tract: Small right renal cysts, stable. No hydronephrosis. Urinary bladder unremarkable. Stomach/Bowel: Extensive sigmoid diverticulosis. Stranding around the mid sigmoid colon compatible with active diverticulitis. No evidence of bowel obstruction. Stomach and small bowel decompressed, unremarkable. Vascular/Lymphatic: Aortic atherosclerosis. No evidence of aneurysm or adenopathy. Reproductive: Prior hysterectomy.  No adnexal masses. Other: Trace free fluid in the pelvis.  No free air. Musculoskeletal: No acute bony abnormality. IMPRESSION:  Extensive sigmoid diverticulosis with surrounding inflammation compatible with active diverticulitis. Electronically Signed   By: Charlett Nose M.D.   On: 07/01/2020 21:23    Procedures Procedures   Medications Ordered in ED Medications  iohexol (OMNIPAQUE) 300 MG/ML solution 100 mL (100 mLs Intravenous Contrast Given 07/01/20 2108)    ED Course  I have reviewed the triage vital signs and the nursing notes.  Pertinent labs & imaging results that were available during my care of the patient were reviewed by me and considered in my medical decision making (see chart for details).    MDM Rules/Calculators/A&P                          Patient presents to the emergency department with 3-day history of lower abdominal pain, predominantly left-sided.  This is similar to episodes of diverticulitis she has had, but she reports it has been quite sometime since her last episode.  Patient's vital signs are unremarkable.  Lab work revealed slight leukocytosis, otherwise unremarkable.  CT scan does confirm uncomplicated diverticulitis.  Patient hydrated, given antiemetics, analgesia and a dose of Unasyn.  Will discharge and continue Augmentin as previously prescribed by her GI doctor, follow-up with GI.  Final Clinical Impression(s) / ED Diagnoses Final diagnoses:  Diverticulitis    Rx / DC Orders ED Discharge Orders    None       Gilda Crease, MD 07/01/20 2345

## 2020-07-01 NOTE — Telephone Encounter (Signed)
Patient called the office states she knows she has diverticulitis - states she has had a flare up - took some cipro that her husband had - she states Dr Karilyn Cota knows she has diverticulitis - is out of cipro would like Dr Karilyn Cota to send a prescription to [facility] in [place] - please advise when it will be sent - ph# [phone]

## 2020-07-02 MED ORDER — ONDANSETRON HCL 4 MG PO TABS: 4.0000 mg | ORAL_TABLET | Freq: Three times a day (TID) | ORAL | 0 refills | Status: DC | PRN

## 2020-07-02 MED ORDER — OXYCODONE-ACETAMINOPHEN 5-325 MG PO TABS: 1.0000 | ORAL_TABLET | ORAL | 0 refills | Status: DC | PRN

## 2020-07-02 MED ORDER — KETOROLAC TROMETHAMINE 30 MG/ML IJ SOLN
15.0000 mg | Freq: Once | INTRAMUSCULAR | Status: AC
  Administered 2020-07-02: 15 mg via INTRAVENOUS
  Filled 2020-07-02: qty 1

## 2020-07-02 NOTE — Telephone Encounter (Signed)
Per Dr. Karilyn Cota patient needs to be on a full liquid diet and she needs to try to take the antibiotic again and if it does not stay down next time we may have to change the antibiotic. Patient states she was able to keep her yogurt down, but if she vomits again advised to call the office to change the medication. She states understanding.

## 2020-07-02 NOTE — Telephone Encounter (Signed)
I called this am to make sure the patient picked up her Antibiotics. She reports that she went to [facility] last night to pick up the medication and felt like she was going to pass out. Someone called EMS and she was taken to the Ed. She states they ran blood work and labs and gave her IV fluids and IV antibiotics, nausea medication and pain medication.The scans they did showed she did in fact have diverticulitis. She states she came home today and had ate 5 crackers then took her antibiotics that you prescribed and she vomited it up. She states after she vomited she ate a yougurt and is trying to make sure she keeps medication down. Patient also states she is not due for a colonoscopy for a while, but wonders if she should have it done earlier. Please advise.

## 2020-07-03 MED FILL — Oxycodone w/ Acetaminophen Tab 5-325 MG: ORAL | Qty: 6 | Status: AC

## 2020-07-06 ENCOUNTER — Telehealth (INDEPENDENT_AMBULATORY_CARE_PROVIDER_SITE_OTHER): Payer: Self-pay | Admitting: *Deleted

## 2020-07-06 NOTE — Telephone Encounter (Signed)
Per Dr.Rehman the patient may use Levsin Sl 0.125 mg - patient is to take by mouth as needed # 30 and no refills. Patient may also use Analpram Cream to the rectum twice a day.  These were called to [facility] [place] and left on the pharmacy voicemail as the pharmacist was to busy to take a verbal. Patient is to take sitz baths then apply the Analpram Cream. She is to take the Levsin while on the Augmentin then she will use the Levsin as needed. If the patient has a fever ,loose or diarrhea she is to let us know at which time we will obtain stool cultures for C-Diff. If insurance does not cover the cream per Dr. Karilyn Cota the patient may use OTC Lidocaine.  Patient called and made aware.

## 2020-07-06 NOTE — Telephone Encounter (Signed)
Patient was called at 11:35 am and a voicemail was left , ask patient to call the office back.

## 2020-07-06 NOTE — Telephone Encounter (Signed)
Patient returned call. She states that she has a huge hemorrhoid, cannot use Preparation H because she is a diabetic and has high blood pressure. She is requesting something be called in to help with the pain and to shrink the hemorrhoid. She also states that she is having urgency to have bowel movements.This is every 20 -30 minutes. When she goes to the bathroom there is no stool on the toilet but when she wipes there is stool on the tissue. Patient cannot remember when she last had a bowel movement. She is taking Colace 2 by mouth at bedtime , she is also taking the probiotic as prescribed. Holding Fiber until she is completley healed from recent attack.   Her diet consist of Yogurt , water and chocolate Glucerna. States that this has been the worst diverticulitis attack ever.

## 2020-07-08 NOTE — Telephone Encounter (Signed)
Per Tammy T. Lpn here at [facility]. She spoke with the patient and patient is scheduled to come here to the office for office visit on 07/09/2020. Patient is aware.

## 2020-07-08 NOTE — Telephone Encounter (Signed)
Patient has called the office today. She states that she is taking the dicyclomine and she is using the cream. She shares that her husband has told her that she is really raw and swollen back there. Patient is very concerned that she has not had a bowel movement. She is taking 2 colace at bedtime.  Discussed with Dr.Rehman. He ask to see the patient in office to exam her. Patient will be seen 07/09/2020 @ 9 am. She is aware.

## 2020-07-08 NOTE — Telephone Encounter (Signed)
Patient left voice mail message wanting a nurse to call her back - ph# [phone]

## 2020-07-08 NOTE — Telephone Encounter (Signed)
I called and left a vm if she still needed anything form [place] to call the office back.

## 2020-07-09 ENCOUNTER — Encounter (INDEPENDENT_AMBULATORY_CARE_PROVIDER_SITE_OTHER): Payer: Self-pay | Admitting: Internal Medicine

## 2020-07-09 ENCOUNTER — Ambulatory Visit (INDEPENDENT_AMBULATORY_CARE_PROVIDER_SITE_OTHER): Payer: 59 | Admitting: Internal Medicine

## 2020-07-09 ENCOUNTER — Other Ambulatory Visit: Payer: Self-pay

## 2020-07-09 VITALS — BP 142/90 | HR 94 | Temp 100.2°F | Ht 64.0 in | Wt 169.0 lb

## 2020-07-09 DIAGNOSIS — K5732 Diverticulitis of large intestine without perforation or abscess without bleeding: Secondary | ICD-10-CM

## 2020-07-09 DIAGNOSIS — K5641 Fecal impaction: Secondary | ICD-10-CM

## 2020-07-09 MED ORDER — AMOXICILLIN-POT CLAVULANATE 875-125 MG PO TABS: 1.0000 | ORAL_TABLET | Freq: Two times a day (BID) | ORAL | 0 refills | Status: DC

## 2020-07-09 MED ORDER — POLYETHYLENE GLYCOL 3350 17 GM/SCOOP PO POWD: 8.5000 g | Freq: Every day | ORAL | 0 refills | Status: DC

## 2020-07-09 NOTE — Telephone Encounter (Signed)
Patient was seen in the doctors office today,07/09/2020.

## 2020-07-09 NOTE — Patient Instructions (Signed)
Fleet enema x2 today. If you do not get relief with fleets enema you can use milk of molasses enemas.  (2 parts milk in 1 part milk of molasses)

## 2020-07-09 NOTE — Progress Notes (Signed)
Presenting complaint;  Recent diagnosis of diverticulitis. Patient with constant urge to have a bowel movement.  Database and subjective:  Patient is 65 year old Caucasian female who has history of sigmoid diverticulitis.  She was last seen in the office about 2 years ago. Patient called her office on 07/01/2020 stating that she had lower abdominal pain for 3 to 4 days and finally figured out that she had diverticulitis.  She wanted Cipro call.  She is intolerant and/or allergic to metronidazole.  I recommended Augmentin.  The evening she developed nausea and vomiting.  She was seen in emergency room that night.  She was diagnosed with sigmoid diverticulitis.  She was treated and discharged.  He has been able to take Augmentin. Patient called our office stating that she had constant urge to have a bowel movement but all she was passing was liquid stool.  She has not had fever or chills.  Lower abdominal pain is improved.  She has to strain in order to void.  Urine does not have any odor.  She denies hematuria. Patient says she is trying to drink more fluids.  She had regular meal yesterday. She has not taken dicyclomine in several months.   Current Medications: Outpatient Encounter Medications as of 07/09/2020  Medication Sig  . amoxicillin-clavulanate (AUGMENTIN) 875-125 MG tablet Take 1 tablet by mouth 2 (two) times daily.  Marland Kitchen atorvastatin (LIPITOR) 20 MG tablet Take 20 mg by mouth daily.  Marland Kitchen dicyclomine (BENTYL) 10 MG capsule Take 1 capsule (10 mg total) by mouth 3 (three) times daily as needed.  . docusate sodium (COLACE) 100 MG capsule Take 1 capsule (100 mg total) by mouth daily. (Patient taking differently: Take 100 mg by mouth 2 (two) times daily. Patient takes at night.)  . EUTHYROX 88 MCG tablet Take 88 mcg by mouth every morning.  . Lactobacillus (PROBIOTIC ACIDOPHILUS PO) Take 1 tablet by mouth daily.  Marland Kitchen lisinopril (PRINIVIL,ZESTRIL) 5 MG tablet Take 5 mg by mouth daily.  . Lutein 20  MG TABS Take 25 mg by mouth daily.   . Melatonin 10 MG TBCR Take 10 mg by mouth at bedtime as needed (for sleep).   . metFORMIN (GLUCOPHAGE) 500 MG tablet Take 500 mg by mouth daily.  . ondansetron (ZOFRAN) 4 MG tablet Take 1 tablet (4 mg total) by mouth every 8 (eight) hours as needed for nausea or vomiting.  Marland Kitchen oxyCODONE-acetaminophen (PERCOCET) 5-325 MG tablet Take 1 tablet by mouth every 4 (four) hours as needed.  . psyllium (METAMUCIL) 58.6 % packet Take 1 packet by mouth daily.  . Inulin 2 g CHEW Chew by mouth daily. (Patient not taking: Reported on 07/09/2020)  . [DISCONTINUED] pravastatin (PRAVACHOL) 40 MG tablet Take 40 mg by mouth at bedtime.   No facility-administered encounter medications on file as of 07/09/2020.   Past Medical History:  Diagnosis Date  . Diabetes mellitus without complication ([place])   . Diverticulitis   .  Hypothyroidism secondary to Hashimoto's disease   . Hyperlipidemia   . Hypertension   .    Marland Kitchen     Past Surgical History:  Procedure Laterality Date  . ABDOMINAL HYSTERECTOMY    . APPENDECTOMY    . CHOLECYSTECTOMY    . COLONOSCOPY N/A 11/27/2014   Procedure: COLONOSCOPY;  Surgeon: Rogene Houston, MD;  Location: [facility];  Service: Endoscopy;  Laterality: N/A;  220 - moved to 1:55 - Ann to notify pt  . COLONOSCOPY N/A 12/28/2017   Procedure: COLONOSCOPY;  Surgeon:  Rogene Houston, MD;  Location: [facility];  Service: Endoscopy;  Laterality: N/A;  10:30  . COLONOSCOPY W/ POLYPECTOMY     2014 Dr. Smitty Knudsen    Objective: Blood pressure (!) 142/90, pulse 94, temperature 100.2 F (37.9 C), temperature source Oral, height $RemoveBefo'5\' 4"'yeNFuwIZCfL$  (1.626 m), weight 169 lb (76.7 kg). Patient is alert and appears to be uncomfortable. He is wearing a mask. Conjunctiva is pink. Sclera is nonicteric Oropharyngeal mucosa is normal. No neck masses or thyromegaly noted. Cardiac exam with regular rhythm normal S1 and S2. No murmur or gallop noted. Lungs are clear to  auscultation. Abdomen is full.  Bowel sounds are normal.  On palpation abdomen is soft.  She has fullness in hypogastric region but no tenderness.  No definite mass palpated.  No organomegaly. Rectal examination reveals normal sphincter tone.  She had large fecal ball.  It is soft to firm.  It was digitally broken up as much as possible. Patient had a bowel movement and passed small amount of stool. No LE edema or clubbing noted.  Labs/studies Results:  CBC Latest Ref Rng & Units 07/01/2020 10/10/2017 02/01/2017  WBC 4.0 - 10.5 K/uL 12.5(H) 14.7(H) 8.3  Hemoglobin 12.0 - 15.0 g/dL 14.6 13.7 13.4  Hematocrit 36.0 - 46.0 % 43.7 39.6 40.7  Platelets 150 - 400 K/uL 188 314 234    CMP Latest Ref Rng & Units 07/01/2020 10/10/2017 02/01/2017  Glucose 70 - 99 mg/dL 150(H) 117 143(H)  BUN 8 - 23 mg/dL 24(H) 23 18  Creatinine 0.44 - 1.00 mg/dL 1.02(H) 0.80 0.81  Sodium 135 - 145 mmol/L 136 138 139  Potassium 3.5 - 5.1 mmol/L 4.2 4.4 4.1  Chloride 98 - 111 mmol/L 98 98 105  CO2 22 - 32 mmol/L $RemoveB'27 27 23  'TOrDvNKS$ Calcium 8.9 - 10.3 mg/dL 9.9 9.8 9.3  Total Protein 6.5 - 8.1 g/dL 7.8 7.1 7.3  Total Bilirubin 0.3 - 1.2 mg/dL 1.3(H) 1.1 0.8  Alkaline Phos 38 - 126 U/L 40 - 41  AST 15 - 41 U/L $Remo'16 13 22  'Bwatr$ ALT 0 - 44 U/L $Remo'17 14 25    'NKEDu$ Hepatic Function Latest Ref Rng & Units 07/01/2020 10/10/2017 02/01/2017  Total Protein 6.5 - 8.1 g/dL 7.8 7.1 7.3  Albumin 3.5 - 5.0 g/dL 4.3 - 4.2  AST 15 - 41 U/L $Remo'16 13 22  'oXCyU$ ALT 0 - 44 U/L $Remo'17 14 25  'GsqYV$ Alk Phosphatase 38 - 126 U/L 40 - 41  Total Bilirubin 0.3 - 1.2 mg/dL 1.3(H) 1.1 0.8    Abdominal pelvic CT from 07/01/2020 reviewed. While she does not appear to be impacted but she did have round fecal ball in the rectum.   Assessment:  #1.  Fecal impaction.  Stool mass was broken up on digital exam.  She will need to use Fleet enemas and MiraLAX by mouth until fecal impaction fully resolved.  Review of CT from 07/01/2020 revealed Nahjae Hoeg fecal ball in the rectum.  I am afraid it has  enlarged since the CT was done.  #2.  History of sigmoid diverticulitis.  This was diagnosed on 07/01/2020 when she was seen in emergency room.  She does not have abdominal tenderness.  She has low-grade fever.  She has few more days of Augmentin left.  Will refill Augmentin in case she may need few more days  #3.  Mildly elevated serum creatinine on recent visit to ER.   Plan:  Medication list updated.  Dicyclomine deleted.   CBC and  metabolic 7. Augmentin refilled. Advised to check temperature twice daily for rest of the week.  If she remains with low-grade fever she will continue Augmentin for total of 2 weeks. Patient can go back on high-fiber diet and should drink a lot of liquids. She will continue Colace fiber supplement and use polyethylene glycol 8.5 g daily or every other day. Patient advised to call office next week. Office visit in 3 months.

## 2020-07-10 ENCOUNTER — Telehealth (INDEPENDENT_AMBULATORY_CARE_PROVIDER_SITE_OTHER): Payer: Self-pay | Admitting: *Deleted

## 2020-07-10 ENCOUNTER — Telehealth (INDEPENDENT_AMBULATORY_CARE_PROVIDER_SITE_OTHER): Payer: Self-pay | Admitting: Internal Medicine

## 2020-07-10 NOTE — Telephone Encounter (Signed)
Noted  

## 2020-07-10 NOTE — Telephone Encounter (Signed)
Patient has called the office and states that she has had a bowel movement. Dr.Rehman made aware.

## 2020-07-10 NOTE — Telephone Encounter (Signed)
Per Dr.Rehman the patient needed a call to follow up on her progress with a bowel movement. Patient states that she has had tiny, minute pieces of stool. She used one enema yesterday with no results. Therefore she did not use the second one. She shares that she is metamucil , miralax, and has eaten an apple.  Patient was reminded of the instructions that Dr.Rehman gave yesterday. To use 2 fleet enemas , if this did not work she may use milk of molasses enema. This would be 2 cups milk and 1 cup of molasses.  [name] states that she is going to use the second fleet enema today. Dr.Rehman had been made aware. Patient was advised that if Dr.Rehman had any further recommendations we would call her back.

## 2020-07-10 NOTE — Telephone Encounter (Signed)
Patient called back and stated that she did not have to use the second enema.

## 2020-07-10 NOTE — Telephone Encounter (Signed)
Patient left voice mail message stating she did not have to use 2nd enema

## 2020-07-16 ENCOUNTER — Telehealth (INDEPENDENT_AMBULATORY_CARE_PROVIDER_SITE_OTHER): Payer: Self-pay | Admitting: *Deleted

## 2020-07-16 NOTE — Telephone Encounter (Signed)
Patient left another message. She states in the message that she would like to know how long Dr.Rehman wants her to take the Miralax and the Metamucil?

## 2020-07-16 NOTE — Telephone Encounter (Signed)
The patient's questions were addressed with Dr.Rehman. He states that the patient is to take the fiber supplement every day , she is to use the polyethylene Glycol 8.5 grams daily or every other day. She is to continue a high fiber diet and drink plenty of fluids. This plan is indefinitely. Patient has a follow up appointment May 17,2022.  [name] was called and made aware of Dr.Rehman's recommendations.

## 2020-07-16 NOTE — Telephone Encounter (Signed)
Patient had called the office on 07/15/2020 asking for a return call. I tried to reach the patient and a voicemail left for her to call the office back.

## 2020-10-20 ENCOUNTER — Encounter (INDEPENDENT_AMBULATORY_CARE_PROVIDER_SITE_OTHER): Payer: Self-pay | Admitting: Internal Medicine

## 2020-10-20 ENCOUNTER — Ambulatory Visit (INDEPENDENT_AMBULATORY_CARE_PROVIDER_SITE_OTHER): Payer: 59 | Admitting: Internal Medicine

## 2020-10-20 ENCOUNTER — Other Ambulatory Visit: Payer: Self-pay

## 2020-10-20 VITALS — BP 125/81 | HR 75 | Temp 98.6°F | Ht 64.0 in | Wt 174.3 lb

## 2020-10-20 DIAGNOSIS — K5900 Constipation, unspecified: Secondary | ICD-10-CM

## 2020-10-20 MED ORDER — POLYETHYLENE GLYCOL 3350 17 GM/SCOOP PO POWD: 8.5000 g | ORAL | 0 refills | Status: AC

## 2020-10-20 NOTE — Patient Instructions (Signed)
Can try polyethylene glycol every other day.  If it does not work can go back to taking it every day.

## 2020-10-20 NOTE — Progress Notes (Signed)
Presenting complaint;  Follow for chronic constipation.  Database and subjective:  Patient is 65 year old Caucasian female who was treated for diverticulitis January 2022.  She was seen in the office on 07/09/2020 for constant urge to have a bowel movement and she was passing liquid stools.  She reported lower abdominal pain abdominal pain had decrease in severity and she was not having nausea or vomiting nausea vomiting. She also had not taken her dicyclomine.  Patient was diagnosed with fecal impaction.  Patient is advised to continue Augmentin for total of 2 weeks.  She was advised to go back on high-fiber diet and begun on Colace and polyethylene glycol. She began to feel better when she finally had a large bowel movement.  She states she is feeling much better.  She could not take Metamucil but she is trying to eat fiber rich foods every day.  She Appell daily.  She also has splendidly in [place] potato skin and fiber 1 cereal.  She may skip a day here and there but then she will have 2 bowel movements on some days.  She feels she has good evacuation.  She wonders if she should continue polyethylene glycol chronically.  She denies melena or rectal bleeding or abdominal pain.  She says she is helping with care for her mother who is 83 years old and has dementia. She also complains of back pain and has an appointment with her orthopedic surgeon today.  Current Medications: Outpatient Encounter Medications as of 10/20/2020  Medication Sig  . ALPRAZolam (XANAX) 1 MG tablet Take 1 mg by mouth at bedtime.  Marland Kitchen atorvastatin (LIPITOR) 20 MG tablet Take 20 mg by mouth daily.  Marland Kitchen docusate sodium (COLACE) 100 MG capsule Take 1 capsule (100 mg total) by mouth daily. (Patient taking differently: Take 100 mg by mouth 2 (two) times daily. Patient takes at night.)  . EUTHYROX 88 MCG tablet Take 88 mcg by mouth every morning.  . Lactobacillus (PROBIOTIC ACIDOPHILUS PO) Take 1 tablet by mouth daily.  Marland Kitchen lisinopril  (PRINIVIL,ZESTRIL) 5 MG tablet Take 5 mg by mouth daily.  . metFORMIN (GLUCOPHAGE) 500 MG tablet Take 500 mg by mouth daily.  . polyethylene glycol powder (GLYCOLAX/MIRALAX) 17 GM/SCOOP powder Take 8.5 g by mouth daily.  . [DISCONTINUED] Lutein 20 MG TABS Take 25 mg by mouth daily.   . psyllium (METAMUCIL) 58.6 % packet Take 1 packet by mouth daily. (Patient not taking: Reported on 10/20/2020)  . [DISCONTINUED] amoxicillin-clavulanate (AUGMENTIN) 875-125 MG tablet Take 1 tablet by mouth 2 (two) times daily. (Patient not taking: Reported on 10/20/2020)  . [DISCONTINUED] Melatonin 10 MG TBCR Take 10 mg by mouth at bedtime as needed (for sleep).  (Patient not taking: Reported on 10/20/2020)  . [DISCONTINUED] ondansetron (ZOFRAN) 4 MG tablet Take 1 tablet (4 mg total) by mouth every 8 (eight) hours as needed for nausea or vomiting. (Patient not taking: Reported on 10/20/2020)  . [DISCONTINUED] oxyCODONE-acetaminophen (PERCOCET) 5-325 MG tablet Take 1 tablet by mouth every 4 (four) hours as needed. (Patient not taking: Reported on 10/20/2020)   No facility-administered encounter medications on file as of 10/20/2020.     Objective: Blood pressure 125/81, pulse 75, temperature 98.6 F (37 C), temperature source Oral, height 5\' 4"  (1.626 m), weight 174 lb 4.8 oz (79.1 kg). Patient is alert and in no acute distress She is wearing a mask. Conjunctiva is pink. Sclera is nonicteric Oropharyngeal mucosa is normal. No neck masses or thyromegaly noted. Cardiac exam with regular rhythm  normal S1 and S2. No murmur or gallop noted. Lungs are clear to auscultation. Abdomen is symmetrical.  She has long right subcostal scar.  On palpation abdomen is soft and nontender with organomegaly or masses. No LE edema or clubbing noted.  Assessment:  #1.  Constipation.  Constipation possibly triggered by an episode of diverticulitis that she had not late January 2022.  She is able to control her constipation with fiber  rich foods and polyethylene glycol at a low dose daily.  She can try it every other day and see how she does.   Plan:  Can try polyethylene glycol 8.5 g every other day and if it does not work she can go back to daily schedule. She should continue with high-fiber diet. Unless she has problems she will return for office visit in 1 year.

## 2020-11-10 ENCOUNTER — Other Ambulatory Visit: Payer: Self-pay | Admitting: Neurosurgery

## 2020-11-10 ENCOUNTER — Other Ambulatory Visit (HOSPITAL_COMMUNITY): Payer: Self-pay | Admitting: Neurosurgery

## 2020-11-10 DIAGNOSIS — M47816 Spondylosis without myelopathy or radiculopathy, lumbar region: Secondary | ICD-10-CM

## 2020-11-20 ENCOUNTER — Other Ambulatory Visit: Payer: Self-pay

## 2020-11-20 ENCOUNTER — Ambulatory Visit (HOSPITAL_COMMUNITY)
Admission: RE | Admit: 2020-11-20 | Discharge: 2020-11-20 | Disposition: A | Discharge status: Home / Self Care | Payer: 59 | Source: Ambulatory Visit | Attending: Neurosurgery | Admitting: Neurosurgery

## 2020-11-20 DIAGNOSIS — M47816 Spondylosis without myelopathy or radiculopathy, lumbar region: Secondary | ICD-10-CM | POA: Diagnosis present

## 2020-11-20 MED ORDER — GADOBUTROL 1 MMOL/ML IV SOLN
7.0000 mL | Freq: Once | INTRAVENOUS | Status: AC | PRN
  Administered 2020-11-20: 11:00:00 7 mL via INTRAVENOUS

## 2020-12-17 ENCOUNTER — Other Ambulatory Visit (HOSPITAL_COMMUNITY): Payer: Self-pay | Admitting: Internal Medicine

## 2020-12-17 DIAGNOSIS — Z1231 Encounter for screening mammogram for malignant neoplasm of breast: Secondary | ICD-10-CM

## 2021-01-11 ENCOUNTER — Ambulatory Visit (HOSPITAL_COMMUNITY)
Admission: RE | Admit: 2021-01-11 | Discharge: 2021-01-11 | Disposition: A | Discharge status: Home / Self Care | Payer: 59 | Source: Ambulatory Visit | Attending: Internal Medicine | Admitting: Internal Medicine

## 2021-01-11 ENCOUNTER — Other Ambulatory Visit: Payer: Self-pay

## 2021-01-11 DIAGNOSIS — Z1231 Encounter for screening mammogram for malignant neoplasm of breast: Secondary | ICD-10-CM | POA: Diagnosis present

## 2021-10-26 ENCOUNTER — Ambulatory Visit (INDEPENDENT_AMBULATORY_CARE_PROVIDER_SITE_OTHER): Payer: 59 | Admitting: Internal Medicine

## 2021-11-24 IMAGING — US US THYROID
2 series · 14 of 25 positions shown · non-contrast
Comparison: None.

CLINICAL DATA: Hypothyroidism

EXAM:
THYROID ULTRASOUND
TECHNIQUE: Ultrasound examination of the thyroid gland and adjacent soft
tissues was performed.

[Series 1: us thyroid · 13 of 33 slices shown (1 of 2)]
[im 1/33]
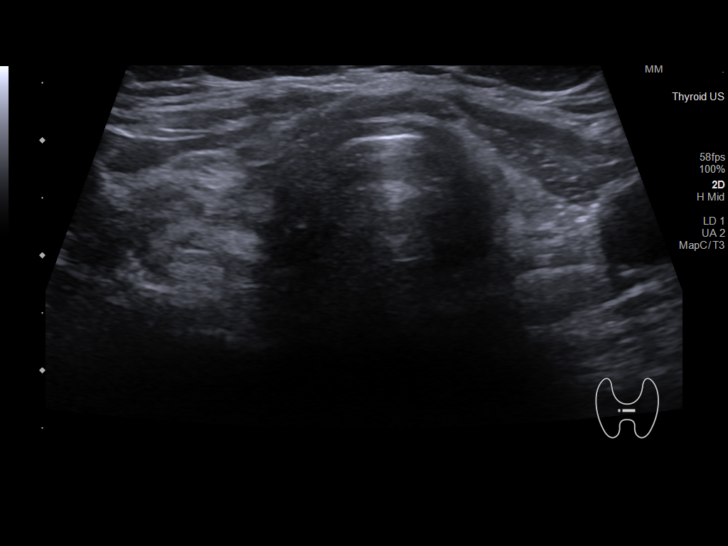
[im 3/33]
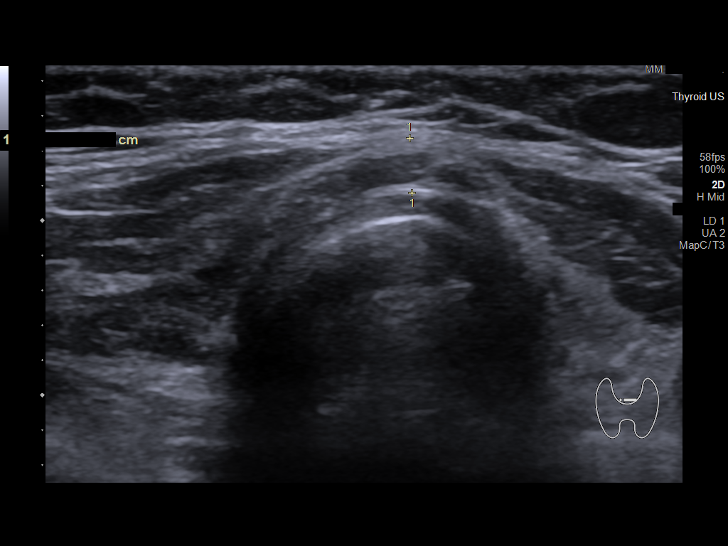
[im 6/33]
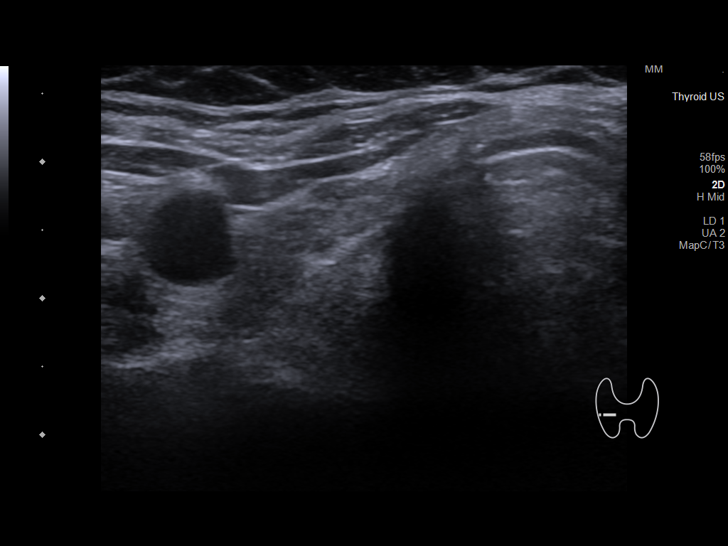
[im 9/33]
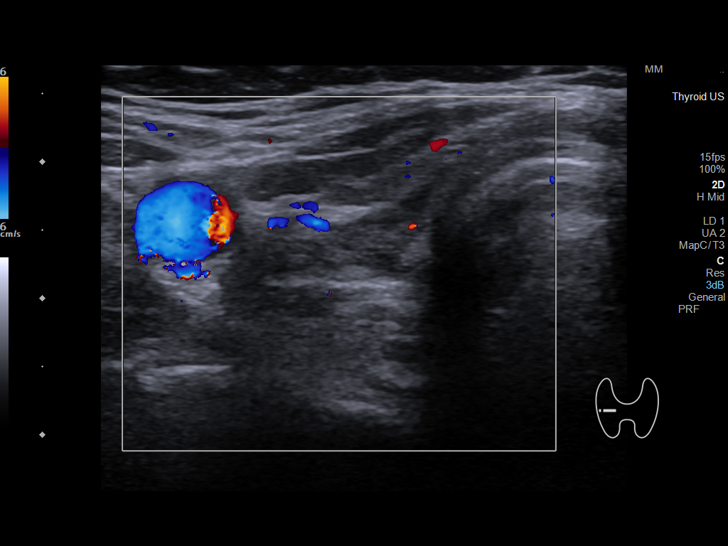
[im 12/33]
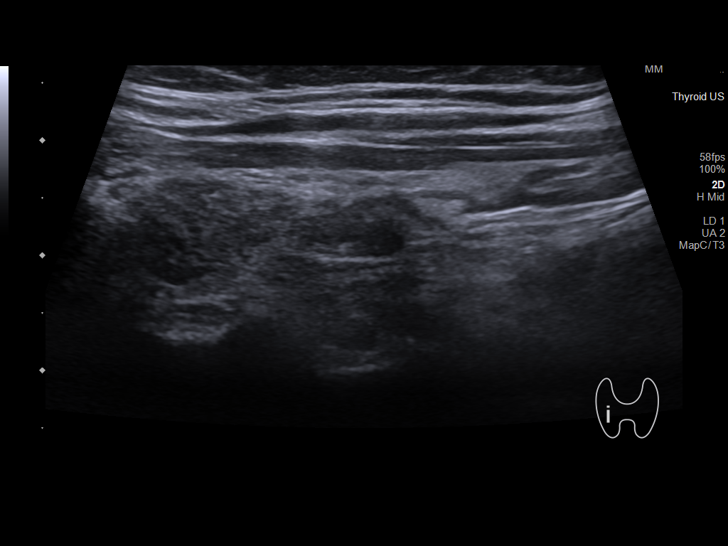
[im 13/33]
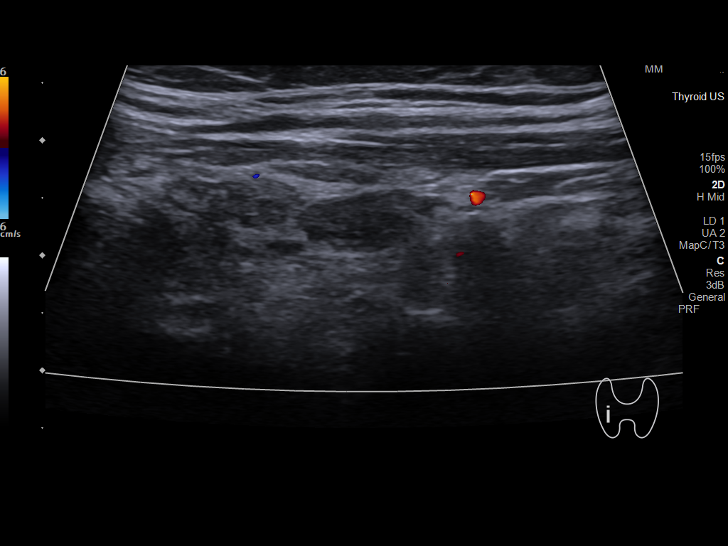
[im 16/33]
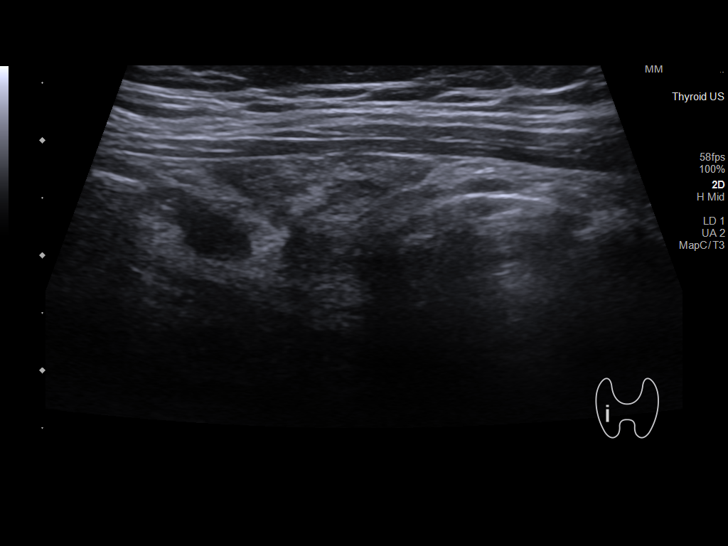
[im 19/33]
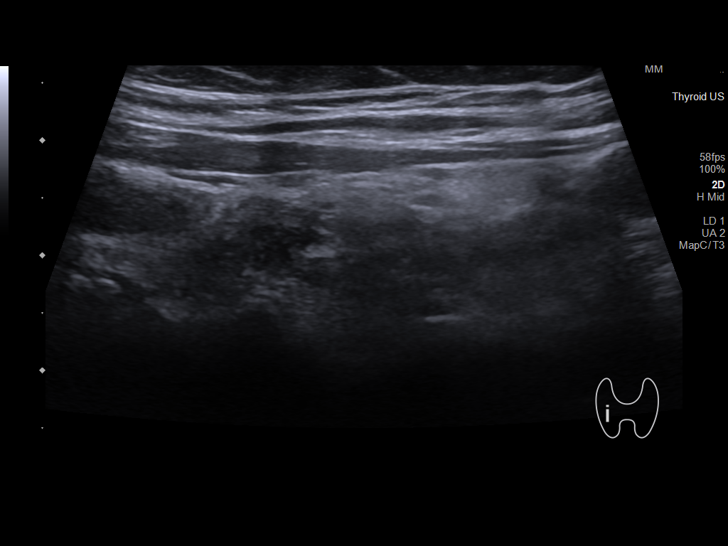
[im 21/33]
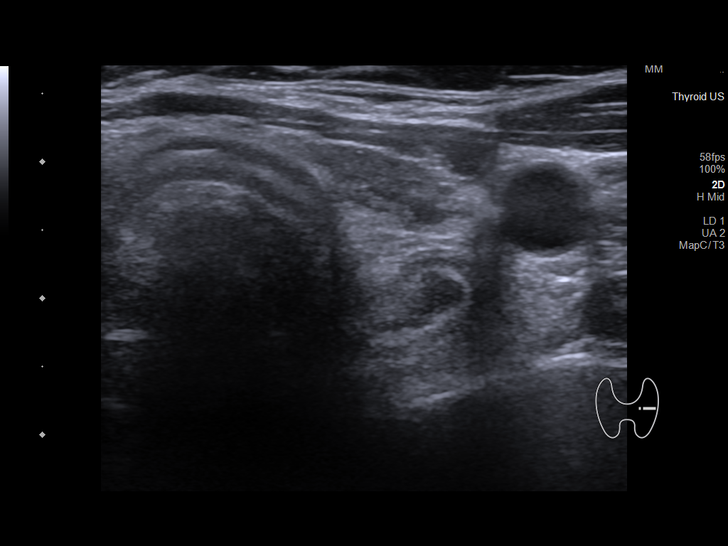
[im 23/33]
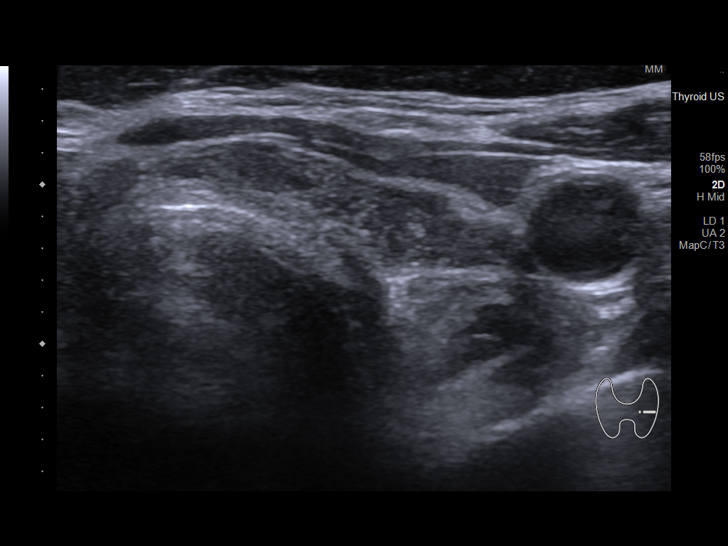
[im 26/33]
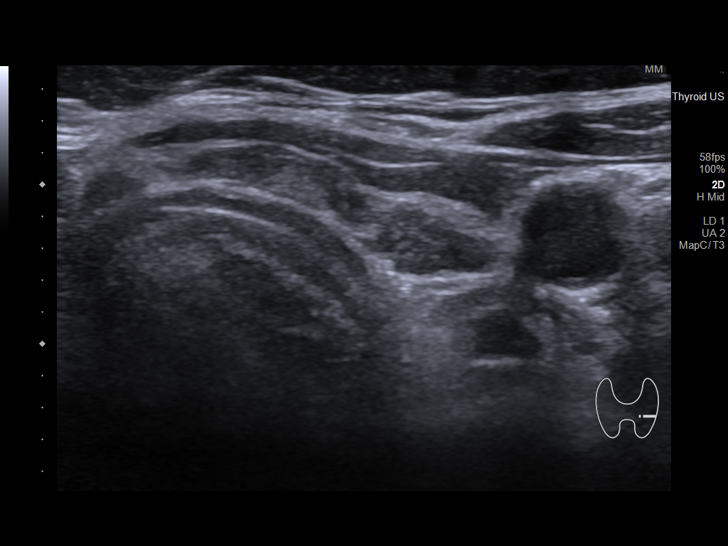
[im 28/33]
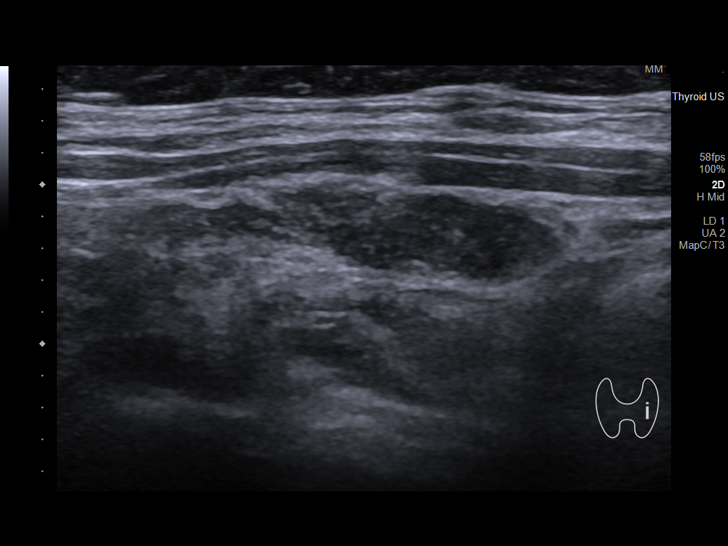
[im 31/33]
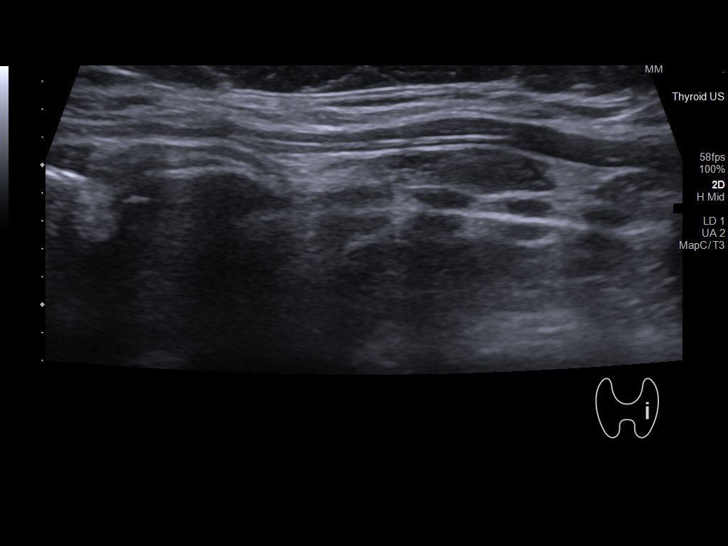

[Series 1: us thyroid · 1 of 1 slices shown (2 of 2)]
[im 1/1]
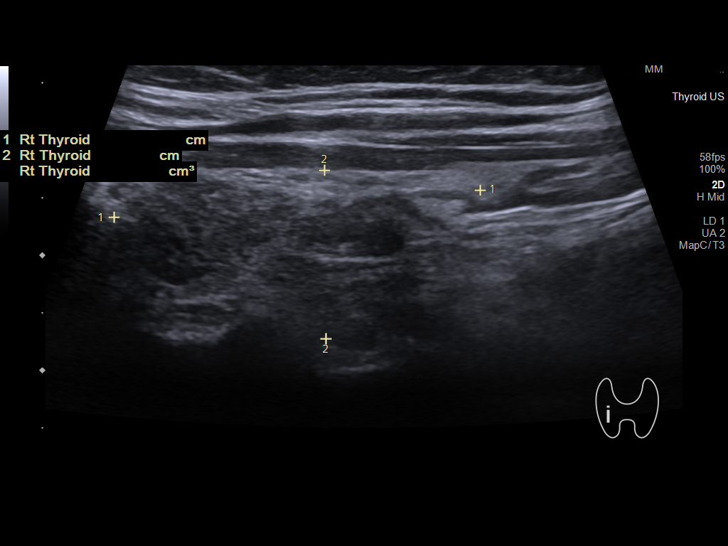

[14 of 25 positions shown; findings below may reference images not displayed]

FINDINGS: Parenchymal Echotexture: Moderately heterogenous

Isthmus: 3 mm

Right lobe: 3.2 x 1.5 x 1.4 cm

Left lobe: 3.1 x 0.6 x 1.0 cm

_________________________________________________________

Estimated total number of nodules >/= 1 cm: 0

Number of spongiform nodules >/=  2 cm not described below (TR1): 0

Number of mixed cystic and solid nodules >/= 1.5 cm not described
below (TR2): 0

_________________________________________________________

Moderate thyroid heterogeneity without hypervascularity or discrete
nodule. No regional adenopathy.
IMPRESSION: Nonspecific thyroid heterogeneity without nodule. No significant
finding by ultrasound.

The above is in keeping with the ACR TI-RADS recommendations - [HOSPITAL] 4563;[DATE].

## 2021-11-29 ENCOUNTER — Other Ambulatory Visit (HOSPITAL_COMMUNITY): Payer: Self-pay | Admitting: Internal Medicine

## 2021-11-29 DIAGNOSIS — Z1231 Encounter for screening mammogram for malignant neoplasm of breast: Secondary | ICD-10-CM

## 2022-01-13 ENCOUNTER — Ambulatory Visit (HOSPITAL_COMMUNITY)
Admission: RE | Admit: 2022-01-13 | Discharge: 2022-01-13 | Disposition: A | Discharge status: Home / Self Care | Payer: 59 | Source: Ambulatory Visit | Attending: Internal Medicine | Admitting: Internal Medicine

## 2022-01-13 DIAGNOSIS — Z1231 Encounter for screening mammogram for malignant neoplasm of breast: Secondary | ICD-10-CM | POA: Insufficient documentation

## 2022-07-01 ENCOUNTER — Ambulatory Visit (HOSPITAL_COMMUNITY): Payer: 59 | Admitting: Occupational Therapy

## 2022-07-19 DIAGNOSIS — E782 Mixed hyperlipidemia: Secondary | ICD-10-CM | POA: Diagnosis not present

## 2022-07-19 DIAGNOSIS — E1169 Type 2 diabetes mellitus with other specified complication: Secondary | ICD-10-CM | POA: Diagnosis not present

## 2022-07-19 DIAGNOSIS — E039 Hypothyroidism, unspecified: Secondary | ICD-10-CM | POA: Diagnosis not present

## 2022-07-25 DIAGNOSIS — R945 Abnormal results of liver function studies: Secondary | ICD-10-CM | POA: Diagnosis not present

## 2022-07-25 DIAGNOSIS — I10 Essential (primary) hypertension: Secondary | ICD-10-CM | POA: Diagnosis not present

## 2022-07-25 DIAGNOSIS — M5136 Other intervertebral disc degeneration, lumbar region: Secondary | ICD-10-CM | POA: Diagnosis not present

## 2022-07-25 DIAGNOSIS — E663 Overweight: Secondary | ICD-10-CM | POA: Diagnosis not present

## 2022-07-25 DIAGNOSIS — E782 Mixed hyperlipidemia: Secondary | ICD-10-CM | POA: Diagnosis not present

## 2022-07-25 DIAGNOSIS — E1169 Type 2 diabetes mellitus with other specified complication: Secondary | ICD-10-CM | POA: Diagnosis not present

## 2022-07-25 DIAGNOSIS — E039 Hypothyroidism, unspecified: Secondary | ICD-10-CM | POA: Diagnosis not present

## 2022-07-25 DIAGNOSIS — K5909 Other constipation: Secondary | ICD-10-CM | POA: Diagnosis not present

## 2022-07-25 DIAGNOSIS — G4700 Insomnia, unspecified: Secondary | ICD-10-CM | POA: Diagnosis not present

## 2022-07-25 DIAGNOSIS — D72829 Elevated white blood cell count, unspecified: Secondary | ICD-10-CM | POA: Diagnosis not present

## 2022-07-25 DIAGNOSIS — M545 Low back pain, unspecified: Secondary | ICD-10-CM | POA: Diagnosis not present

## 2022-07-25 DIAGNOSIS — H538 Other visual disturbances: Secondary | ICD-10-CM | POA: Diagnosis not present

## 2022-09-06 DIAGNOSIS — M545 Low back pain, unspecified: Secondary | ICD-10-CM | POA: Diagnosis not present

## 2022-09-06 DIAGNOSIS — E039 Hypothyroidism, unspecified: Secondary | ICD-10-CM | POA: Diagnosis not present

## 2022-09-06 DIAGNOSIS — G4700 Insomnia, unspecified: Secondary | ICD-10-CM | POA: Diagnosis not present

## 2022-09-06 DIAGNOSIS — I10 Essential (primary) hypertension: Secondary | ICD-10-CM | POA: Diagnosis not present

## 2022-09-06 DIAGNOSIS — E1169 Type 2 diabetes mellitus with other specified complication: Secondary | ICD-10-CM | POA: Diagnosis not present

## 2022-09-06 DIAGNOSIS — M5136 Other intervertebral disc degeneration, lumbar region: Secondary | ICD-10-CM | POA: Diagnosis not present

## 2022-09-06 DIAGNOSIS — F411 Generalized anxiety disorder: Secondary | ICD-10-CM | POA: Diagnosis not present

## 2022-09-06 DIAGNOSIS — K5909 Other constipation: Secondary | ICD-10-CM | POA: Diagnosis not present

## 2022-09-06 DIAGNOSIS — F331 Major depressive disorder, recurrent, moderate: Secondary | ICD-10-CM | POA: Diagnosis not present

## 2022-09-06 DIAGNOSIS — E663 Overweight: Secondary | ICD-10-CM | POA: Diagnosis not present

## 2022-09-06 DIAGNOSIS — G8929 Other chronic pain: Secondary | ICD-10-CM | POA: Diagnosis not present

## 2022-09-06 DIAGNOSIS — R5383 Other fatigue: Secondary | ICD-10-CM | POA: Diagnosis not present

## 2022-10-20 DIAGNOSIS — L820 Inflamed seborrheic keratosis: Secondary | ICD-10-CM | POA: Diagnosis not present

## 2022-10-20 DIAGNOSIS — C44319 Basal cell carcinoma of skin of other parts of face: Secondary | ICD-10-CM | POA: Diagnosis not present

## 2022-11-07 ENCOUNTER — Encounter (INDEPENDENT_AMBULATORY_CARE_PROVIDER_SITE_OTHER): Payer: Self-pay | Admitting: *Deleted

## 2022-11-14 ENCOUNTER — Telehealth (INDEPENDENT_AMBULATORY_CARE_PROVIDER_SITE_OTHER): Payer: Self-pay | Admitting: *Deleted

## 2022-11-14 NOTE — Telephone Encounter (Signed)
TCS recall questionnaire rec'd, given to scheduler

## 2022-11-16 ENCOUNTER — Telehealth (INDEPENDENT_AMBULATORY_CARE_PROVIDER_SITE_OTHER): Payer: Self-pay | Admitting: Gastroenterology

## 2022-11-16 NOTE — Telephone Encounter (Signed)
Who is your primary care physician: Dr.Zach Margo Aye  Reasons for the colonoscopy: 5 year recall  Have you had a colonoscopy before?  yes  Do you have family history of colon cancer? Yes; all of my fathers brothers and sisters  Previous colonoscopy with polyps removed? yes  Do you have a history colorectal cancer?   no  Are you diabetic? If yes, Type 1 or Type 2?    Yes type 2  Do you have a prosthetic or mechanical heart valve? no  Do you have a pacemaker/defibrillator?   no  Have you had endocarditis/atrial fibrillation? no  Have you had joint replacement within the last 12 months?  no  Do you tend to be constipated or have to use laxatives? Constipated  Do you have any history of drugs or alchohol?  no  Do you use supplemental oxygen?  no  Have you had a stroke or heart attack within the last 6 months? no  Do you take weight loss medication?  no  For female patients: have you had a hysterectomy?  yes                                     are you post menopausal?       no                                            do you still have your menstrual cycle? no      Do you take any blood-thinning medications such as: (aspirin, warfarin, Plavix, Aggrenox)  no  If yes we need the name, milligram, dosage and who is prescribing doctor  Current Outpatient Medications on File Prior to Visit  Medication Sig Dispense Refill   ALPRAZolam (XANAX) 1 MG tablet Take 1 mg by mouth at bedtime.     atorvastatin (LIPITOR) 20 MG tablet Take 20 mg by mouth daily.     docusate sodium (COLACE) 100 MG capsule Take 1 capsule (100 mg total) by mouth daily. (Patient taking differently: Take 100 mg by mouth 2 (two) times daily. Patient takes at night.) 10 capsule 0   Lactobacillus (PROBIOTIC ACIDOPHILUS PO) Take 1 tablet by mouth daily.     levothyroxine (SYNTHROID) 100 MCG tablet Take 100 mcg by mouth daily.     lisinopril (PRINIVIL,ZESTRIL) 5 MG tablet Take 5 mg by mouth daily.     Lutein-Zeaxanthin  (OCUVITE LUTEIN 25 PO) Take 25 mg by mouth daily.     metFORMIN (GLUCOPHAGE) 500 MG tablet Take 500 mg by mouth daily.     polyethylene glycol powder (GLYCOLAX/MIRALAX) 17 GM/SCOOP powder Take 8.5 g by mouth every other day. 255 g 0   RYBELSUS 7 MG TABS Take 1 tablet by mouth daily.     EUTHYROX 88 MCG tablet Take 88 mcg by mouth every morning. (Patient not taking: Reported on 11/16/2022)     psyllium (METAMUCIL) 58.6 % packet Take 1 packet by mouth daily. (Patient not taking: Reported on 10/20/2020)     No current facility-administered medications on file prior to visit.    Allergies  Allergen Reactions   Flagyl [Metronidazole] Other (See Comments)    Patient states that this medication makes her deathly sick.   Morphine And Codeine Nausea And Vomiting     Pharmacy: [facility]  Primary Insurance Name: [employer]  Best number where you can be reached: [phone]

## 2022-11-16 NOTE — Telephone Encounter (Signed)
Room 1, Rybelsus per protocol Thanks

## 2022-11-18 DIAGNOSIS — E782 Mixed hyperlipidemia: Secondary | ICD-10-CM | POA: Diagnosis not present

## 2022-11-18 DIAGNOSIS — E039 Hypothyroidism, unspecified: Secondary | ICD-10-CM | POA: Diagnosis not present

## 2022-11-18 DIAGNOSIS — E1169 Type 2 diabetes mellitus with other specified complication: Secondary | ICD-10-CM | POA: Diagnosis not present

## 2022-11-18 NOTE — Telephone Encounter (Signed)
Pt contacted and TCS scheduled for 12/09/22 at 11:30am. Pt requesting the simplest prep-Clenpiq sample and instructions placed up front.

## 2022-11-18 NOTE — Telephone Encounter (Signed)
Questionnaire from recall, no referral needed  

## 2022-11-24 DIAGNOSIS — F331 Major depressive disorder, recurrent, moderate: Secondary | ICD-10-CM | POA: Diagnosis not present

## 2022-11-24 DIAGNOSIS — E663 Overweight: Secondary | ICD-10-CM | POA: Diagnosis not present

## 2022-11-24 DIAGNOSIS — M5136 Other intervertebral disc degeneration, lumbar region: Secondary | ICD-10-CM | POA: Diagnosis not present

## 2022-11-24 DIAGNOSIS — M545 Low back pain, unspecified: Secondary | ICD-10-CM | POA: Diagnosis not present

## 2022-11-24 DIAGNOSIS — K5909 Other constipation: Secondary | ICD-10-CM | POA: Diagnosis not present

## 2022-11-24 DIAGNOSIS — E1169 Type 2 diabetes mellitus with other specified complication: Secondary | ICD-10-CM | POA: Diagnosis not present

## 2022-11-24 DIAGNOSIS — H538 Other visual disturbances: Secondary | ICD-10-CM | POA: Diagnosis not present

## 2022-11-24 DIAGNOSIS — E039 Hypothyroidism, unspecified: Secondary | ICD-10-CM | POA: Diagnosis not present

## 2022-11-24 DIAGNOSIS — G4700 Insomnia, unspecified: Secondary | ICD-10-CM | POA: Diagnosis not present

## 2022-11-24 DIAGNOSIS — E782 Mixed hyperlipidemia: Secondary | ICD-10-CM | POA: Diagnosis not present

## 2022-11-24 DIAGNOSIS — Z0000 Encounter for general adult medical examination without abnormal findings: Secondary | ICD-10-CM | POA: Diagnosis not present

## 2022-11-24 DIAGNOSIS — I10 Essential (primary) hypertension: Secondary | ICD-10-CM | POA: Diagnosis not present

## 2022-12-09 ENCOUNTER — Other Ambulatory Visit: Payer: Self-pay

## 2022-12-09 ENCOUNTER — Encounter (HOSPITAL_COMMUNITY)
Admission: RE | Disposition: A | Discharge status: Home / Self Care | Payer: Self-pay | Source: Home / Self Care | Attending: Gastroenterology

## 2022-12-09 ENCOUNTER — Ambulatory Visit (HOSPITAL_COMMUNITY)
Admission: RE | Admit: 2022-12-09 | Discharge: 2022-12-09 | Disposition: A | Discharge status: Home / Self Care | Payer: HMO | Attending: Gastroenterology | Admitting: Gastroenterology

## 2022-12-09 ENCOUNTER — Ambulatory Visit (HOSPITAL_COMMUNITY): Payer: HMO | Admitting: Anesthesiology

## 2022-12-09 ENCOUNTER — Encounter (HOSPITAL_COMMUNITY): Payer: Self-pay | Admitting: Gastroenterology

## 2022-12-09 DIAGNOSIS — Z8051 Family history of malignant neoplasm of kidney: Secondary | ICD-10-CM | POA: Diagnosis not present

## 2022-12-09 DIAGNOSIS — E063 Autoimmune thyroiditis: Secondary | ICD-10-CM | POA: Diagnosis not present

## 2022-12-09 DIAGNOSIS — Z79899 Other long term (current) drug therapy: Secondary | ICD-10-CM | POA: Diagnosis not present

## 2022-12-09 DIAGNOSIS — K5730 Diverticulosis of large intestine without perforation or abscess without bleeding: Secondary | ICD-10-CM

## 2022-12-09 DIAGNOSIS — K648 Other hemorrhoids: Secondary | ICD-10-CM | POA: Diagnosis not present

## 2022-12-09 DIAGNOSIS — E785 Hyperlipidemia, unspecified: Secondary | ICD-10-CM | POA: Diagnosis not present

## 2022-12-09 DIAGNOSIS — Z79890 Hormone replacement therapy (postmenopausal): Secondary | ICD-10-CM | POA: Diagnosis not present

## 2022-12-09 DIAGNOSIS — K644 Residual hemorrhoidal skin tags: Secondary | ICD-10-CM | POA: Diagnosis not present

## 2022-12-09 DIAGNOSIS — Z7984 Long term (current) use of oral hypoglycemic drugs: Secondary | ICD-10-CM | POA: Diagnosis not present

## 2022-12-09 DIAGNOSIS — Z800 Family history of malignant neoplasm of digestive organs: Secondary | ICD-10-CM

## 2022-12-09 DIAGNOSIS — E119 Type 2 diabetes mellitus without complications: Secondary | ICD-10-CM | POA: Diagnosis not present

## 2022-12-09 DIAGNOSIS — Z87891 Personal history of nicotine dependence: Secondary | ICD-10-CM | POA: Diagnosis not present

## 2022-12-09 DIAGNOSIS — Z1211 Encounter for screening for malignant neoplasm of colon: Secondary | ICD-10-CM | POA: Insufficient documentation

## 2022-12-09 DIAGNOSIS — I10 Essential (primary) hypertension: Secondary | ICD-10-CM | POA: Insufficient documentation

## 2022-12-09 DIAGNOSIS — E039 Hypothyroidism, unspecified: Secondary | ICD-10-CM | POA: Insufficient documentation

## 2022-12-09 HISTORY — PX: COLONOSCOPY WITH PROPOFOL: SHX5780

## 2022-12-09 LAB — GLUCOSE, CAPILLARY: Glucose-Capillary: 91 mg/dL (ref 70–99)

## 2022-12-09 LAB — HM COLONOSCOPY

## 2022-12-09 SURGERY — COLONOSCOPY WITH PROPOFOL
Anesthesia: General

## 2022-12-09 MED ORDER — LACTATED RINGERS IV SOLN: INTRAVENOUS | Status: DC | PRN

## 2022-12-09 MED ORDER — PHENYLEPHRINE 80 MCG/ML (10ML) SYRINGE FOR IV PUSH (FOR BLOOD PRESSURE SUPPORT)
PREFILLED_SYRINGE | INTRAVENOUS | Status: DC | PRN
  Administered 2022-12-09: 80 ug via INTRAVENOUS

## 2022-12-09 MED ORDER — LACTATED RINGERS IV SOLN: INTRAVENOUS | Status: DC

## 2022-12-09 MED ORDER — PROPOFOL 500 MG/50ML IV EMUL
INTRAVENOUS | Status: DC | PRN
  Administered 2022-12-09: 150 ug/kg/min via INTRAVENOUS

## 2022-12-09 MED ORDER — PROPOFOL 10 MG/ML IV BOLUS
INTRAVENOUS | Status: DC | PRN
  Administered 2022-12-09: 100 mg via INTRAVENOUS

## 2022-12-09 MED ORDER — PROPOFOL 10 MG/ML IV BOLUS
INTRAVENOUS | Status: AC
  Filled 2022-12-09: qty 20

## 2022-12-09 MED ORDER — LIDOCAINE HCL (CARDIAC) PF 100 MG/5ML IV SOSY
PREFILLED_SYRINGE | INTRAVENOUS | Status: DC | PRN
  Administered 2022-12-09: 80 mg via INTRAVENOUS

## 2022-12-09 NOTE — Transfer of Care (Addendum)
Immediate Anesthesia Transfer of Care Note  Patient: [name]  Procedure(s) Performed: COLONOSCOPY WITH PROPOFOL  Patient Location: PACU  Anesthesia Type:General  Level of Consciousness: drowsy and patient cooperative  Airway & Oxygen Therapy: Patient Spontanous Breathing  Post-op Assessment: Report given to RN and Post -op Vital signs reviewed and stable  Post vital signs: Reviewed and stable  Last Vitals:  Vitals Value Taken Time  BP 104/51 12/06/22   1149  Temp 36.4 12/06/22   1149  Pulse 75 12/06/22   1149  Resp 13 12/06/22   1149  SpO2 98% 12/06/22   1149    Last Pain:  Vitals:   12/09/22 1121  TempSrc:   PainSc: 0-No pain      Patients Stated Pain Goal: 9 (12/09/22 0950)  Complications: No notable events documented.

## 2022-12-09 NOTE — Anesthesia Preprocedure Evaluation (Signed)
Anesthesia Evaluation  Patient identified by MRN, date of birth, ID band Patient awake    Reviewed: Allergy & Precautions, H&P , NPO status , Patient's Chart, lab work & pertinent test results, reviewed documented beta blocker date and time   Airway Mallampati: II  TM Distance: >3 FB Neck ROM: full    Dental no notable dental hx.    Pulmonary neg pulmonary ROS, former smoker   Pulmonary exam normal breath sounds clear to auscultation       Cardiovascular Exercise Tolerance: Good hypertension, negative cardio ROS  Rhythm:regular Rate:Normal     Neuro/Psych negative neurological ROS  negative psych ROS   GI/Hepatic negative GI ROS, Neg liver ROS,,,  Endo/Other  negative endocrine ROSdiabetesHypothyroidism    Renal/GU negative Renal ROS  negative genitourinary   Musculoskeletal   Abdominal   Peds  Hematology negative hematology ROS (+)   Anesthesia Other Findings   Reproductive/Obstetrics negative OB ROS                             Anesthesia Physical Anesthesia Plan  ASA: 2  Anesthesia Plan: General   Post-op Pain Management:    Induction:   PONV Risk Score and Plan: Propofol infusion  Airway Management Planned:   Additional Equipment:   Intra-op Plan:   Post-operative Plan:   Informed Consent: I have reviewed the patients History and Physical, chart, labs and discussed the procedure including the risks, benefits and alternatives for the proposed anesthesia with the patient or authorized representative who has indicated his/her understanding and acceptance.     Dental Advisory Given  Plan Discussed with: CRNA  Anesthesia Plan Comments:        Anesthesia Quick Evaluation

## 2022-12-09 NOTE — Discharge Instructions (Signed)
You are being discharged to home.  Resume your previous diet.  Your physician has recommended a repeat colonoscopy in five years for surveillance.  

## 2022-12-09 NOTE — Op Note (Signed)
[facility] Patient Name: [name] Procedure Date: 12/09/2022 11:06 AM MRN: [id number] Date of Birth: 1955-07-30 Attending MD: Katrinka Blazing , , [id number] CSN: [id number] Age: 67 Admit Type: Outpatient Procedure:                Colonoscopy Indications:              Screening for colon cancer: Family history of                            colorectal cancer in multiple 2nd degree relatives Providers:                Katrinka Blazing, Edrick Kins, RN, Lennice Sites                            Technician, Technician Referring MD:              Medicines:                Monitored Anesthesia Care Complications:            No immediate complications. Estimated Blood Loss:     Estimated blood loss: none. Procedure:                Pre-Anesthesia Assessment:                           - Prior to the procedure, a History and Physical                            was performed, and patient medications, allergies                            and sensitivities were reviewed. The patient's                            tolerance of previous anesthesia was reviewed.                           - The risks and benefits of the procedure and the                            sedation options and risks were discussed with the                            patient. All questions were answered and informed                            consent was obtained.                           - ASA Grade Assessment: II - A patient with mild                            systemic disease.                           After obtaining informed consent, the colonoscope  was passed under direct vision. Throughout the                            procedure, the patient's blood pressure, pulse, and                            oxygen saturations were monitored continuously. The                            [id number]-[id number] ([id number]) scope was introduced through                            the anus and advanced to the the cecum,  identified                            by appendiceal orifice and ileocecal valve. The                            colonoscopy was performed without difficulty. The                            patient tolerated the procedure well. The quality                            of the bowel preparation was good. Scope In: 11:27:06 AM Scope Out: 11:45:35 AM Scope Withdrawal Time: 0 hours 9 minutes 41 seconds  Total Procedure Duration: 0 hours 18 minutes 29 seconds  Findings:      Hemorrhoids were found on perianal exam.      Scattered large-mouthed and small-mouthed diverticula were found in the       sigmoid colon, descending colon and transverse colon.      Non-bleeding external and internal hemorrhoids were found during       retroflexion. The hemorrhoids were small. Impression:               - Hemorrhoids found on perianal exam.                           - Diverticulosis in the sigmoid colon, in the                            descending colon and in the transverse colon.                           - Non-bleeding external and internal hemorrhoids.                           - No specimens collected. Moderate Sedation:      Per Anesthesia Care Recommendation:           - Discharge patient to home (ambulatory).                           - Resume previous diet.                           -  Repeat colonoscopy in 5 years for surveillance. Procedure Code(s):        --- Professional ---                           Z6109, Colorectal cancer screening; colonoscopy on                            individual not meeting criteria for high risk Diagnosis Code(s):        --- Professional ---                           Z80.0, Family history of malignant neoplasm of                            digestive organs                           K64.8, Other hemorrhoids                           K57.30, Diverticulosis of large intestine without                            perforation or abscess without bleeding CPT copyright  2022 [employer]. All rights reserved. The codes documented in this report are preliminary and upon coder review may  be revised to meet current compliance requirements. Katrinka Blazing, MD Katrinka Blazing,  12/09/2022 11:49:21 AM This report has been signed electronically. Number of Addenda: 0

## 2022-12-09 NOTE — Anesthesia Postprocedure Evaluation (Signed)
Anesthesia Post Note  Patient: [name]  Procedure(s) Performed: COLONOSCOPY WITH PROPOFOL  Patient location during evaluation: Phase II Anesthesia Type: General Level of consciousness: awake Pain management: pain level controlled Vital Signs Assessment: post-procedure vital signs reviewed and stable Respiratory status: spontaneous breathing and respiratory function stable Cardiovascular status: blood pressure returned to baseline and stable Postop Assessment: no headache and no apparent nausea or vomiting Anesthetic complications: no Comments: Late entry   No notable events documented.   Last Vitals:  Vitals:   12/09/22 0950 12/09/22 1149  BP: (!) 144/66 (!) 104/51  Pulse: 73 75  Resp: 15 13  Temp: 36.9 C 36.4 C  SpO2: 100% 98%    Last Pain:  Vitals:   12/09/22 1149  TempSrc: Oral  PainSc: 0-No pain                 Windell Norfolk

## 2022-12-09 NOTE — H&P (Signed)
[name] is an 67 y.o. female.   Chief Complaint: Family history of colon cancer HPI: 67 year old female with past medical history of hyperlipidemia, hypertension, hypothyroidism, diverticulitis, diabetes, coming for family history of colon cancer.  The patient denies having any nausea, vomiting, fever, chills, hematochezia, melena, hematemesis, abdominal distention, abdominal pain, diarrhea, jaundice, pruritus or weight loss.  Patient reported she had multiple ankles and arms that were diagnosed with colon cancer in their 40s and 50s.  Her father was diagnosed with kidney cancer as well.  Past Medical History:  Diagnosis Date   Diabetes mellitus without complication (HCC)    Diverticulitis    Hashimoto's disease    Hyperlipidemia    Hypertension    Hypothyroidism    Thyroid disease     Past Surgical History:  Procedure Laterality Date   ABDOMINAL HYSTERECTOMY     APPENDECTOMY     CHOLECYSTECTOMY     COLONOSCOPY N/A 11/27/2014   Procedure: COLONOSCOPY;  Surgeon: Malissa Hippo, MD;  Location: [facility];  Service: Endoscopy;  Laterality: N/A;  220 - moved to 1:55 - Ann to notify pt   COLONOSCOPY N/A 12/28/2017   Procedure: COLONOSCOPY;  Surgeon: Malissa Hippo, MD;  Location: [facility];  Service: Endoscopy;  Laterality: N/A;  10:30   COLONOSCOPY W/ POLYPECTOMY     2014 Dr. Fonda Kinder    History reviewed. No pertinent family history. Social History:  reports that she quit smoking about 17 years ago. Her smoking use included cigarettes. She has a 15.00 pack-year smoking history. She has never used smokeless tobacco. She reports that she does not drink alcohol and does not use drugs.  Allergies:  Allergies  Allergen Reactions   Flagyl [Metronidazole] Other (See Comments)    Patient states that this medication makes her deathly sick.   Morphine And Codeine Nausea And Vomiting    Medications Prior to Admission  Medication Sig Dispense Refill   ALPRAZolam  (XANAX) 1 MG tablet Take 1 mg by mouth at bedtime.     atorvastatin (LIPITOR) 20 MG tablet Take 20 mg by mouth every evening.     Bacillus Coagulans-Inulin (ALIGN PREBIOTIC-PROBIOTIC) 5-1.25 MG-GM CHEW Chew 2 each by mouth daily.     docusate sodium (COLACE) 100 MG capsule Take 1 capsule (100 mg total) by mouth daily. (Patient taking differently: Take 200 mg by mouth at bedtime.) 10 capsule 0   levothyroxine (SYNTHROID) 100 MCG tablet Take 100 mcg by mouth daily before breakfast.     lisinopril (PRINIVIL,ZESTRIL) 5 MG tablet Take 5 mg by mouth daily.     Lutein 20 MG TABS Take 20 mg by mouth daily.     metFORMIN (GLUCOPHAGE) 500 MG tablet Take 500 mg by mouth daily.     polyethylene glycol powder (GLYCOLAX/MIRALAX) 17 GM/SCOOP powder Take 8.5 g by mouth every other day. (Patient taking differently: Take 17 g by mouth daily as needed for moderate constipation.) 255 g 0   RYBELSUS 7 MG TABS Take 7 mg by mouth daily.      No results found for this or any previous visit (from the past 48 hour(s)). No results found.  Review of Systems  All other systems reviewed and are negative.   Blood pressure (!) 144/66, pulse 73, temperature 98.4 F (36.9 C), temperature source Oral, resp. rate 15, height 5\' 4"  (1.626 m), weight 71.7 kg, SpO2 100 %. Physical Exam  GENERAL: The patient is AO x3, in no acute distress. HEENT: Head is normocephalic  and atraumatic. EOMI are intact. Mouth is well hydrated and without lesions. NECK: Supple. No masses LUNGS: Clear to auscultation. No presence of rhonchi/wheezing/rales. Adequate chest expansion HEART: RRR, normal s1 and s2. ABDOMEN: Soft, nontender, no guarding, no peritoneal signs, and nondistended. BS +. No masses. EXTREMITIES: Without any cyanosis, clubbing, rash, lesions or edema. NEUROLOGIC: AOx3, no focal motor deficit. SKIN: no jaundice, no rashes  Assessment/Plan 67 year old female with past medical history of hyperlipidemia, hypertension,  hypothyroidism, diverticulitis, diabetes, coming for family history of colon cancer.  Will proceed with colonoscopy.  Given the fact she has multiple members with colon cancer and 1 member with kidney cancer, I offered the possibility of having genetic counseling to rule out Lynch syndrome but the patient declined this for now and will let me know if interested.  Dolores Frame, MD 12/09/2022, 10:15 AM

## 2022-12-12 ENCOUNTER — Other Ambulatory Visit (HOSPITAL_COMMUNITY): Payer: Self-pay | Admitting: Internal Medicine

## 2022-12-12 ENCOUNTER — Encounter (INDEPENDENT_AMBULATORY_CARE_PROVIDER_SITE_OTHER): Payer: Self-pay | Admitting: *Deleted

## 2022-12-12 DIAGNOSIS — Z1231 Encounter for screening mammogram for malignant neoplasm of breast: Secondary | ICD-10-CM

## 2022-12-15 ENCOUNTER — Encounter (HOSPITAL_COMMUNITY): Payer: Self-pay | Admitting: Gastroenterology

## 2022-12-29 DIAGNOSIS — Z1283 Encounter for screening for malignant neoplasm of skin: Secondary | ICD-10-CM | POA: Diagnosis not present

## 2022-12-29 DIAGNOSIS — X32XXXA Exposure to sunlight, initial encounter: Secondary | ICD-10-CM | POA: Diagnosis not present

## 2022-12-29 DIAGNOSIS — Z08 Encounter for follow-up examination after completed treatment for malignant neoplasm: Secondary | ICD-10-CM | POA: Diagnosis not present

## 2022-12-29 DIAGNOSIS — L570 Actinic keratosis: Secondary | ICD-10-CM | POA: Diagnosis not present

## 2022-12-29 DIAGNOSIS — D225 Melanocytic nevi of trunk: Secondary | ICD-10-CM | POA: Diagnosis not present

## 2022-12-29 DIAGNOSIS — Z85828 Personal history of other malignant neoplasm of skin: Secondary | ICD-10-CM | POA: Diagnosis not present

## 2022-12-29 DIAGNOSIS — L820 Inflamed seborrheic keratosis: Secondary | ICD-10-CM | POA: Diagnosis not present

## 2023-01-16 ENCOUNTER — Ambulatory Visit (HOSPITAL_COMMUNITY)
Admission: RE | Admit: 2023-01-16 | Discharge: 2023-01-16 | Disposition: A | Discharge status: Home / Self Care | Payer: HMO | Source: Ambulatory Visit | Attending: Internal Medicine | Admitting: Internal Medicine

## 2023-01-16 DIAGNOSIS — Z1231 Encounter for screening mammogram for malignant neoplasm of breast: Secondary | ICD-10-CM | POA: Insufficient documentation

## 2023-01-26 DIAGNOSIS — L720 Epidermal cyst: Secondary | ICD-10-CM | POA: Diagnosis not present

## 2023-03-21 DIAGNOSIS — E039 Hypothyroidism, unspecified: Secondary | ICD-10-CM | POA: Diagnosis not present

## 2023-03-21 DIAGNOSIS — E1169 Type 2 diabetes mellitus with other specified complication: Secondary | ICD-10-CM | POA: Diagnosis not present

## 2023-03-21 DIAGNOSIS — E782 Mixed hyperlipidemia: Secondary | ICD-10-CM | POA: Diagnosis not present

## 2023-03-27 DIAGNOSIS — E782 Mixed hyperlipidemia: Secondary | ICD-10-CM | POA: Diagnosis not present

## 2023-03-27 DIAGNOSIS — Z23 Encounter for immunization: Secondary | ICD-10-CM | POA: Diagnosis not present

## 2023-03-27 DIAGNOSIS — E663 Overweight: Secondary | ICD-10-CM | POA: Diagnosis not present

## 2023-03-27 DIAGNOSIS — E1169 Type 2 diabetes mellitus with other specified complication: Secondary | ICD-10-CM | POA: Diagnosis not present

## 2023-03-27 DIAGNOSIS — M545 Low back pain, unspecified: Secondary | ICD-10-CM | POA: Diagnosis not present

## 2023-03-27 DIAGNOSIS — I10 Essential (primary) hypertension: Secondary | ICD-10-CM | POA: Diagnosis not present

## 2023-03-27 DIAGNOSIS — Z0000 Encounter for general adult medical examination without abnormal findings: Secondary | ICD-10-CM | POA: Diagnosis not present

## 2023-03-27 DIAGNOSIS — F5105 Insomnia due to other mental disorder: Secondary | ICD-10-CM | POA: Diagnosis not present

## 2023-03-27 DIAGNOSIS — M51369 Other intervertebral disc degeneration, lumbar region without mention of lumbar back pain or lower extremity pain: Secondary | ICD-10-CM | POA: Diagnosis not present

## 2023-03-27 DIAGNOSIS — H538 Other visual disturbances: Secondary | ICD-10-CM | POA: Diagnosis not present

## 2023-03-27 DIAGNOSIS — E039 Hypothyroidism, unspecified: Secondary | ICD-10-CM | POA: Diagnosis not present

## 2023-03-27 DIAGNOSIS — K5909 Other constipation: Secondary | ICD-10-CM | POA: Diagnosis not present

## 2023-06-29 DIAGNOSIS — E119 Type 2 diabetes mellitus without complications: Secondary | ICD-10-CM | POA: Diagnosis not present

## 2023-09-19 DIAGNOSIS — E039 Hypothyroidism, unspecified: Secondary | ICD-10-CM | POA: Diagnosis not present

## 2023-09-19 DIAGNOSIS — E1169 Type 2 diabetes mellitus with other specified complication: Secondary | ICD-10-CM | POA: Diagnosis not present

## 2023-09-19 DIAGNOSIS — E782 Mixed hyperlipidemia: Secondary | ICD-10-CM | POA: Diagnosis not present

## 2023-09-25 DIAGNOSIS — K5909 Other constipation: Secondary | ICD-10-CM | POA: Diagnosis not present

## 2023-09-25 DIAGNOSIS — E1169 Type 2 diabetes mellitus with other specified complication: Secondary | ICD-10-CM | POA: Diagnosis not present

## 2023-09-25 DIAGNOSIS — Z0001 Encounter for general adult medical examination with abnormal findings: Secondary | ICD-10-CM | POA: Diagnosis not present

## 2023-09-25 DIAGNOSIS — E039 Hypothyroidism, unspecified: Secondary | ICD-10-CM | POA: Diagnosis not present

## 2023-09-25 DIAGNOSIS — F331 Major depressive disorder, recurrent, moderate: Secondary | ICD-10-CM | POA: Diagnosis not present

## 2023-09-25 DIAGNOSIS — M51369 Other intervertebral disc degeneration, lumbar region without mention of lumbar back pain or lower extremity pain: Secondary | ICD-10-CM | POA: Diagnosis not present

## 2023-09-25 DIAGNOSIS — H538 Other visual disturbances: Secondary | ICD-10-CM | POA: Diagnosis not present

## 2023-09-25 DIAGNOSIS — G4700 Insomnia, unspecified: Secondary | ICD-10-CM | POA: Diagnosis not present

## 2023-09-25 DIAGNOSIS — E782 Mixed hyperlipidemia: Secondary | ICD-10-CM | POA: Diagnosis not present

## 2023-09-25 DIAGNOSIS — M545 Low back pain, unspecified: Secondary | ICD-10-CM | POA: Diagnosis not present

## 2023-09-25 DIAGNOSIS — I10 Essential (primary) hypertension: Secondary | ICD-10-CM | POA: Diagnosis not present

## 2023-09-25 DIAGNOSIS — E663 Overweight: Secondary | ICD-10-CM | POA: Diagnosis not present

## 2023-10-05 DIAGNOSIS — R491 Aphonia: Secondary | ICD-10-CM | POA: Diagnosis not present

## 2023-10-08 DIAGNOSIS — J029 Acute pharyngitis, unspecified: Secondary | ICD-10-CM | POA: Diagnosis not present

## 2023-10-08 DIAGNOSIS — Z6829 Body mass index (BMI) 29.0-29.9, adult: Secondary | ICD-10-CM | POA: Diagnosis not present

## 2023-10-08 DIAGNOSIS — E663 Overweight: Secondary | ICD-10-CM | POA: Diagnosis not present

## 2023-11-03 DIAGNOSIS — Z713 Dietary counseling and surveillance: Secondary | ICD-10-CM | POA: Diagnosis not present

## 2023-11-03 DIAGNOSIS — E1169 Type 2 diabetes mellitus with other specified complication: Secondary | ICD-10-CM | POA: Diagnosis not present

## 2023-11-03 DIAGNOSIS — Z79899 Other long term (current) drug therapy: Secondary | ICD-10-CM | POA: Diagnosis not present

## 2023-11-03 DIAGNOSIS — E1159 Type 2 diabetes mellitus with other circulatory complications: Secondary | ICD-10-CM | POA: Diagnosis not present

## 2023-11-03 DIAGNOSIS — Z7182 Exercise counseling: Secondary | ICD-10-CM | POA: Diagnosis not present

## 2023-11-03 DIAGNOSIS — I10 Essential (primary) hypertension: Secondary | ICD-10-CM | POA: Diagnosis not present

## 2023-11-03 DIAGNOSIS — E663 Overweight: Secondary | ICD-10-CM | POA: Diagnosis not present

## 2023-11-03 DIAGNOSIS — Z6826 Body mass index (BMI) 26.0-26.9, adult: Secondary | ICD-10-CM | POA: Diagnosis not present

## 2023-11-03 DIAGNOSIS — Z87891 Personal history of nicotine dependence: Secondary | ICD-10-CM | POA: Diagnosis not present

## 2023-11-03 DIAGNOSIS — Z7984 Long term (current) use of oral hypoglycemic drugs: Secondary | ICD-10-CM | POA: Diagnosis not present

## 2023-12-04 ENCOUNTER — Other Ambulatory Visit (HOSPITAL_COMMUNITY): Payer: Self-pay | Admitting: Internal Medicine

## 2023-12-04 DIAGNOSIS — Z1231 Encounter for screening mammogram for malignant neoplasm of breast: Secondary | ICD-10-CM

## 2024-01-04 DIAGNOSIS — E1169 Type 2 diabetes mellitus with other specified complication: Secondary | ICD-10-CM | POA: Diagnosis not present

## 2024-01-04 DIAGNOSIS — E782 Mixed hyperlipidemia: Secondary | ICD-10-CM | POA: Diagnosis not present

## 2024-01-04 DIAGNOSIS — E039 Hypothyroidism, unspecified: Secondary | ICD-10-CM | POA: Diagnosis not present

## 2024-01-10 DIAGNOSIS — G4700 Insomnia, unspecified: Secondary | ICD-10-CM | POA: Diagnosis not present

## 2024-01-10 DIAGNOSIS — K5909 Other constipation: Secondary | ICD-10-CM | POA: Diagnosis not present

## 2024-01-10 DIAGNOSIS — I10 Essential (primary) hypertension: Secondary | ICD-10-CM | POA: Diagnosis not present

## 2024-01-10 DIAGNOSIS — F411 Generalized anxiety disorder: Secondary | ICD-10-CM | POA: Diagnosis not present

## 2024-01-10 DIAGNOSIS — H538 Other visual disturbances: Secondary | ICD-10-CM | POA: Diagnosis not present

## 2024-01-10 DIAGNOSIS — E1169 Type 2 diabetes mellitus with other specified complication: Secondary | ICD-10-CM | POA: Diagnosis not present

## 2024-01-10 DIAGNOSIS — F331 Major depressive disorder, recurrent, moderate: Secondary | ICD-10-CM | POA: Diagnosis not present

## 2024-01-10 DIAGNOSIS — M51369 Other intervertebral disc degeneration, lumbar region without mention of lumbar back pain or lower extremity pain: Secondary | ICD-10-CM | POA: Diagnosis not present

## 2024-01-10 DIAGNOSIS — M545 Low back pain, unspecified: Secondary | ICD-10-CM | POA: Diagnosis not present

## 2024-01-10 DIAGNOSIS — E039 Hypothyroidism, unspecified: Secondary | ICD-10-CM | POA: Diagnosis not present

## 2024-01-10 DIAGNOSIS — R5383 Other fatigue: Secondary | ICD-10-CM | POA: Diagnosis not present

## 2024-01-10 DIAGNOSIS — E782 Mixed hyperlipidemia: Secondary | ICD-10-CM | POA: Diagnosis not present

## 2024-01-17 ENCOUNTER — Ambulatory Visit (HOSPITAL_COMMUNITY)
Admission: RE | Admit: 2024-01-17 | Discharge: 2024-01-17 | Disposition: A | Discharge status: Home / Self Care | Source: Ambulatory Visit | Attending: Internal Medicine | Admitting: Internal Medicine

## 2024-01-17 ENCOUNTER — Encounter (HOSPITAL_COMMUNITY): Payer: Self-pay

## 2024-01-17 DIAGNOSIS — Z1231 Encounter for screening mammogram for malignant neoplasm of breast: Secondary | ICD-10-CM | POA: Diagnosis not present

## 2024-03-20 ENCOUNTER — Encounter (INDEPENDENT_AMBULATORY_CARE_PROVIDER_SITE_OTHER): Payer: Self-pay | Admitting: Gastroenterology
